# Patient Record
Sex: Male | Born: 1975 | Race: White | Hispanic: No | Smoking: Former smoker
Health system: Southern US, Community
[De-identification: ages and names within clinical notes are randomized; demographics above are authoritative.]

## PROBLEM LIST (undated history)

## (undated) DIAGNOSIS — M87051 Idiopathic aseptic necrosis of right femur: Secondary | ICD-10-CM

## (undated) DIAGNOSIS — F32A Depression, unspecified: Secondary | ICD-10-CM

## (undated) DIAGNOSIS — K449 Diaphragmatic hernia without obstruction or gangrene: Secondary | ICD-10-CM

## (undated) DIAGNOSIS — F329 Major depressive disorder, single episode, unspecified: Secondary | ICD-10-CM

## (undated) DIAGNOSIS — F419 Anxiety disorder, unspecified: Secondary | ICD-10-CM

## (undated) DIAGNOSIS — K219 Gastro-esophageal reflux disease without esophagitis: Secondary | ICD-10-CM

## (undated) DIAGNOSIS — I10 Essential (primary) hypertension: Secondary | ICD-10-CM

## (undated) HISTORY — PX: TONSILLECTOMY: SUR1361

## (undated) HISTORY — DX: Gastro-esophageal reflux disease without esophagitis: K21.9

---

## 2010-11-11 ENCOUNTER — Emergency Department (HOSPITAL_COMMUNITY): Payer: Self-pay

## 2010-11-11 ENCOUNTER — Emergency Department (HOSPITAL_COMMUNITY)
Admission: EM | Admit: 2010-11-11 | Discharge: 2010-11-11 | Disposition: A | Discharge status: Home / Self Care | Payer: Self-pay | Attending: Emergency Medicine | Admitting: Emergency Medicine

## 2010-11-11 DIAGNOSIS — R109 Unspecified abdominal pain: Secondary | ICD-10-CM | POA: Insufficient documentation

## 2010-11-11 DIAGNOSIS — S2239XA Fracture of one rib, unspecified side, initial encounter for closed fracture: Secondary | ICD-10-CM | POA: Insufficient documentation

## 2010-11-11 DIAGNOSIS — R1011 Right upper quadrant pain: Secondary | ICD-10-CM | POA: Insufficient documentation

## 2010-11-11 DIAGNOSIS — F3289 Other specified depressive episodes: Secondary | ICD-10-CM | POA: Insufficient documentation

## 2010-11-11 DIAGNOSIS — K219 Gastro-esophageal reflux disease without esophagitis: Secondary | ICD-10-CM | POA: Insufficient documentation

## 2010-11-11 DIAGNOSIS — R079 Chest pain, unspecified: Secondary | ICD-10-CM | POA: Insufficient documentation

## 2010-11-11 DIAGNOSIS — F329 Major depressive disorder, single episode, unspecified: Secondary | ICD-10-CM | POA: Insufficient documentation

## 2010-11-11 LAB — POCT I-STAT, CHEM 8
Calcium, Ion: 1.15 mmol/L (ref 1.12–1.32)
Chloride: 104 mEq/L (ref 96–112)
HCT: 40 % (ref 39.0–52.0)
Potassium: 4 mEq/L (ref 3.5–5.1)
Sodium: 141 mEq/L (ref 135–145)

## 2010-11-11 LAB — DIFFERENTIAL
Basophils Relative: 0 % (ref 0–1)
Eosinophils Absolute: 0.4 10*3/uL (ref 0.0–0.7)
Lymphs Abs: 1.7 10*3/uL (ref 0.7–4.0)
Neutro Abs: 2.9 10*3/uL (ref 1.7–7.7)
Neutrophils Relative %: 51 % (ref 43–77)

## 2010-11-11 LAB — CBC
MCV: 85.7 fL (ref 78.0–100.0)
Platelets: 158 10*3/uL (ref 150–400)
RBC: 4.56 MIL/uL (ref 4.22–5.81)
WBC: 5.7 10*3/uL (ref 4.0–10.5)

## 2010-11-11 MED ORDER — IOHEXOL 300 MG/ML  SOLN
100.0000 mL | Freq: Once | INTRAMUSCULAR | Status: AC | PRN
  Administered 2010-11-11: 100 mL via INTRAVENOUS

## 2013-04-18 ENCOUNTER — Encounter (HOSPITAL_COMMUNITY): Payer: Self-pay | Admitting: Emergency Medicine

## 2013-04-18 ENCOUNTER — Emergency Department (HOSPITAL_COMMUNITY): Payer: Self-pay

## 2013-04-18 ENCOUNTER — Emergency Department (HOSPITAL_COMMUNITY)
Admission: EM | Admit: 2013-04-18 | Discharge: 2013-04-18 | Disposition: A | Discharge status: Home / Self Care | Payer: Self-pay | Attending: Emergency Medicine | Admitting: Emergency Medicine

## 2013-04-18 DIAGNOSIS — Y929 Unspecified place or not applicable: Secondary | ICD-10-CM | POA: Insufficient documentation

## 2013-04-18 DIAGNOSIS — F17200 Nicotine dependence, unspecified, uncomplicated: Secondary | ICD-10-CM | POA: Insufficient documentation

## 2013-04-18 DIAGNOSIS — Z8719 Personal history of other diseases of the digestive system: Secondary | ICD-10-CM | POA: Insufficient documentation

## 2013-04-18 DIAGNOSIS — R296 Repeated falls: Secondary | ICD-10-CM | POA: Insufficient documentation

## 2013-04-18 DIAGNOSIS — S298XXA Other specified injuries of thorax, initial encounter: Secondary | ICD-10-CM | POA: Insufficient documentation

## 2013-04-18 DIAGNOSIS — I10 Essential (primary) hypertension: Secondary | ICD-10-CM | POA: Insufficient documentation

## 2013-04-18 DIAGNOSIS — R0789 Other chest pain: Secondary | ICD-10-CM

## 2013-04-18 DIAGNOSIS — Y939 Activity, unspecified: Secondary | ICD-10-CM | POA: Insufficient documentation

## 2013-04-18 DIAGNOSIS — Z79899 Other long term (current) drug therapy: Secondary | ICD-10-CM | POA: Insufficient documentation

## 2013-04-18 HISTORY — DX: Diaphragmatic hernia without obstruction or gangrene: K44.9

## 2013-04-18 HISTORY — DX: Essential (primary) hypertension: I10

## 2013-04-18 LAB — I-STAT CHEM 8, ED
BUN: 8 mg/dL (ref 6–23)
CALCIUM ION: 1.16 mmol/L (ref 1.12–1.23)
CREATININE: 0.8 mg/dL (ref 0.50–1.35)
Chloride: 104 mEq/L (ref 96–112)
GLUCOSE: 94 mg/dL (ref 70–99)
HEMATOCRIT: 44 % (ref 39.0–52.0)
HEMOGLOBIN: 15 g/dL (ref 13.0–17.0)
POTASSIUM: 3.6 meq/L — AB (ref 3.7–5.3)
Sodium: 140 mEq/L (ref 137–147)
TCO2: 24 mmol/L (ref 0–100)

## 2013-04-18 LAB — I-STAT TROPONIN, ED: Troponin i, poc: 0 ng/mL (ref 0.00–0.08)

## 2013-04-18 MED ORDER — OXYCODONE-ACETAMINOPHEN 5-325 MG PO TABS: 1.0000 | ORAL_TABLET | Freq: Four times a day (QID) | ORAL | Status: DC | PRN

## 2013-04-18 NOTE — ED Provider Notes (Addendum)
CSN: [id number]     Arrival date & time 04/18/13  1133 History   First MD Initiated Contact with Patient 04/18/13 1320     Chief Complaint  Patient presents with  . Rib Injury     (Consider location/radiation/quality/duration/timing/severity/associated sxs/prior Treatment) Patient is a 38 y.o. male presenting with chest pain. The history is provided by the patient.  Chest Pain Pain location:  L chest Pain quality: sharp, shooting and stabbing   Pain radiates to:  Does not radiate Pain radiates to the back: no   Pain severity:  Severe Onset quality:  Sudden Duration:  24 hours Timing:  Constant Progression:  Unchanged Chronicity:  New Context: breathing, lifting and raising an arm   Relieved by:  Nothing Worsened by:  Certain positions, coughing, deep breathing and movement Ineffective treatments: nsaids. Associated symptoms: no abdominal pain, no back pain, no cough, no fever, no nausea, no shortness of breath, not vomiting and no weakness   Risk factors: hypertension and smoking   Risk factors: no coronary artery disease and no diabetes mellitus     Past Medical History  Diagnosis Date  . Hernia, hiatal   . Hypertension    History reviewed. No pertinent past surgical history. History reviewed. No pertinent family history. History  Substance Use Topics  . Smoking status: Current Some Day Smoker  . Smokeless tobacco: Not on file  . Alcohol Use: Yes     Comment: social    Review of Systems  Constitutional: Negative for fever.  Respiratory: Negative for cough and shortness of breath.   Cardiovascular: Positive for chest pain.  Gastrointestinal: Negative for nausea, vomiting and abdominal pain.  Musculoskeletal: Negative for back pain.  Neurological: Negative for weakness.  All other systems reviewed and are negative.      Allergies  Review of patient's allergies indicates no known allergies.  Home Medications   Current Outpatient Rx  Name  Route  Sig   Dispense  Refill  . ibuprofen (ADVIL,MOTRIN) 200 MG tablet   Oral   Take 400 mg by mouth every 6 (six) hours as needed.         . metoprolol (LOPRESSOR) 50 MG tablet   Oral   Take 50 mg by mouth daily. Verified with pharmacy not XL and once daily         . omeprazole (PRILOSEC) 20 MG capsule   Oral   Take 20 mg by mouth daily.          BP 144/90  Pulse 97  Temp(Src) 98.4 F (36.9 C) (Oral)  Resp 20  SpO2 98% Physical Exam  Nursing note and vitals reviewed. Constitutional: He is oriented to person, place, and time. He appears well-developed and well-nourished. No distress.  HENT:  Head: Normocephalic and atraumatic.  Mouth/Throat: Oropharynx is clear and moist.  Eyes: Conjunctivae and EOM are normal. Pupils are equal, round, and reactive to light.  Neck: Normal range of motion. Neck supple.  Cardiovascular: Normal rate, regular rhythm and intact distal pulses.   No murmur heard. Pulmonary/Chest: Effort normal and breath sounds normal. No respiratory distress. He has no wheezes. He has no rales. He exhibits tenderness. He exhibits no crepitus and no retraction.    Abdominal: Soft. He exhibits no distension. There is no tenderness. There is no rebound and no guarding.  Musculoskeletal: Normal range of motion. He exhibits no edema and no tenderness.  Neurological: He is alert and oriented to person, place, and time.  Skin: Skin is warm and  dry. No rash noted. No erythema.  Psychiatric: He has a normal mood and affect. His behavior is normal.    ED Course  Procedures (including critical care time) Labs Review Labs Reviewed  I-STAT CHEM 8, ED - Abnormal; Notable for the following:    Potassium 3.6 (*)    All other components within normal limits  I-STAT TROPOININ, ED   Imaging Review Dg Chest 2 View  04/18/2013   CLINICAL DATA:  Pain post trauma  EXAM: CHEST  2 VIEW  COMPARISON:  July 22, 2011  FINDINGS: The lungs are clear. The heart size and pulmonary vascularity  are normal. No adenopathy. No pneumothorax. No fractures are apparent.  IMPRESSION: No abnormality noted.   Electronically Signed   By: Lowella Grip M.D.   On: 04/18/2013 13:09     EKG Interpretation   Date/Time:  Wednesday April 18 2013 11:50:20 EST Ventricular Rate:  98 PR Interval:  136 QRS Duration: 94 QT Interval:  343 QTC Calculation: 438 R Axis:   67 Text Interpretation:  Sinus rhythm Borderline T wave abnormalities  Baseline wander in lead(s) II No previous tracing Confirmed by Maryan Rued   MD, Loree Fee ([id number]) on 04/18/2013 1:30:55 PM      MDM   Final diagnoses:  Chest wall pain    Pt with mechanical fall on Thursday from his loader onto concrete with persistent right-sided chest pain and arm pain. It patient states that yesterday he developed severe left pectoralis pain that is worse with deep breathing and moving his arm. Significantly tender to palpation with clear lung sounds and normal heart sounds.  Chest x-ray within normal limits an EKG showed nonspecific T-wave inversion in V2 and V3.  Patient denies any associated symptoms that have a low suspicion for cardiac disease. He has no risk factors for PE and he perked negative. Feel most likely the pain is from the fall as there is no sign of rash concerning for zoster.  We'll get an outside EKG from [facility] to compare to insure his EKG is unchanged.  2:12 PM Unable to find old EKG.  i-stat and trop ordered.  2:37 PM Labs wnl.  Pt has had pain now for 24hours so would expect trop to be elevated.  Low heart score.  Pt d/ced home with pain meds.  Blanchie Dessert, MD 04/18/13 [place], MD 04/18/13 1439

## 2013-04-18 NOTE — Discharge Instructions (Signed)

## 2013-04-18 NOTE — ED Notes (Signed)
Per pt, he fell Thursday on concrete.  No pain prior to fall.  Pt states fell on right side.  Pain to rt side and arm and now pain presenting to left chest.  Pt states pain increased with breathing, any movement. Pain increases with raising arms.  No coughing blood.

## 2015-12-09 ENCOUNTER — Other Ambulatory Visit: Payer: Self-pay | Admitting: Neurosurgery

## 2015-12-09 DIAGNOSIS — M5416 Radiculopathy, lumbar region: Secondary | ICD-10-CM

## 2015-12-18 ENCOUNTER — Other Ambulatory Visit: Payer: Self-pay | Admitting: Neurosurgery

## 2015-12-23 ENCOUNTER — Ambulatory Visit
Admission: RE | Admit: 2015-12-23 | Discharge: 2015-12-23 | Disposition: A | Discharge status: Home / Self Care | Payer: BLUE CROSS/BLUE SHIELD | Source: Ambulatory Visit | Attending: Neurosurgery | Admitting: Neurosurgery

## 2015-12-23 DIAGNOSIS — M5416 Radiculopathy, lumbar region: Secondary | ICD-10-CM

## 2015-12-23 MED ORDER — METHYLPREDNISOLONE ACETATE 40 MG/ML INJ SUSP (RADIOLOG
120.0000 mg | Freq: Once | INTRAMUSCULAR | Status: AC
  Administered 2015-12-23: 120 mg via EPIDURAL

## 2015-12-23 MED ORDER — IOPAMIDOL (ISOVUE-M 200) INJECTION 41%
1.0000 mL | Freq: Once | INTRAMUSCULAR | Status: AC
  Administered 2015-12-23: 1 mL via EPIDURAL

## 2015-12-23 NOTE — Discharge Instructions (Signed)

## 2016-01-19 ENCOUNTER — Other Ambulatory Visit: Payer: Self-pay | Admitting: Neurosurgery

## 2016-01-19 DIAGNOSIS — M5416 Radiculopathy, lumbar region: Secondary | ICD-10-CM

## 2016-05-28 ENCOUNTER — Encounter: Payer: Self-pay | Admitting: Gastroenterology

## 2016-06-03 NOTE — Progress Notes (Addendum)
PCP: Dr. Myna Bright  Cardiologist: pt denies  EKG: pt denies past year  Stress test: pt denies ever  ECHO: pt denies ever  Cardiac Cath: pt denies ever  Chest x-ray: pt denies past year

## 2016-06-03 NOTE — Pre-Procedure Instructions (Signed)
Judge Stall II  06/03/2016      [id number] [address], [place] [address] Phone: [phone] Fax: [phone]  [facility] [address], [place] [address] Phone: [phone] Fax: [phone]    Your procedure is scheduled on May 1 at 1045 AM.  Report to [facility] at 845 AM.  Call this number if you have problems the morning of surgery:  [phone]   Remember:  Do not eat food or drink liquids after midnight.  Take these medicines the morning of surgery with A SIP OF WATER omeprazole (prilosec), vortioxetine (trintellix), oxycodone=acetaminophen (percocet)-if needed for pain.  Take all other medications as prescribed except 7 days prior to surgery STOP taking any Aspirin, Aleve, Naproxen, Ibuprofen, Motrin, Advil, Goody's, BC's, all herbal medications, fish oil, and all vitamins   Do not wear jewelry, make-up or nail polish.  Do not wear lotions, powders, or perfumes, or deoderant.             Men may shave face and neck.  Do not bring valuables to the hospital.  [facility] is not responsible for any belongings or valuables.  Contacts, dentures or bridgework may not be worn into surgery.  Leave your suitcase in the car.  After surgery it may be brought to your room.  For patients admitted to the hospital, discharge time will be determined by your treatment team.  Patients discharged the day of surgery will not be allowed to drive home.    Special instructions:   [facility]- Preparing For Surgery  Before surgery, you can play an important role. Because skin is not sterile, your skin needs to be as free of germs as possible. You can reduce the number of germs on your skin by washing with CHG (chlorahexidine gluconate) Soap before surgery.  CHG is an antiseptic cleaner which kills germs and bonds with the skin to continue killing germs even after  washing.  Please do not use if you have an allergy to CHG or antibacterial soaps. If your skin becomes reddened/irritated stop using the CHG.  Do not shave (including legs and underarms) for at least 48 hours prior to first CHG shower. It is OK to shave your face.  Please follow these instructions carefully.   1. Shower the NIGHT BEFORE SURGERY and the MORNING OF SURGERY with CHG.   2. If you chose to wash your hair, wash your hair first as usual with your normal shampoo.  3. After you shampoo, rinse your hair and body thoroughly to remove the shampoo.  4. Use CHG as you would any other liquid soap. You can apply CHG directly to the skin and wash gently with a scrungie or a clean washcloth.   5. Apply the CHG Soap to your body ONLY FROM THE NECK DOWN.  Do not use on open wounds or open sores. Avoid contact with your eyes, ears, mouth and genitals (private parts). Wash genitals (private parts) with your normal soap.  6. Wash thoroughly, paying special attention to the area where your surgery will be performed.  7. Thoroughly rinse your body with warm water from the neck down.  8. DO NOT shower/wash with your normal soap after using and rinsing off the CHG Soap.  9. Pat yourself dry with a CLEAN TOWEL.   10. Wear CLEAN PAJAMAS   11. Place CLEAN SHEETS on your  bed the night of your first shower and DO NOT SLEEP WITH PETS.    Day of Surgery: Do not apply any deodorants/lotions. Please wear clean clothes to the hospital/surgery center.     Please read over the following fact sheets that you were given. Pain Booklet, Coughing and Deep Breathing, MRSA Information and Surgical Site Infection Prevention

## 2016-06-04 ENCOUNTER — Encounter (HOSPITAL_COMMUNITY)
Admission: RE | Admit: 2016-06-04 | Discharge: 2016-06-04 | Disposition: A | Discharge status: Home / Self Care | Payer: BLUE CROSS/BLUE SHIELD | Source: Ambulatory Visit | Attending: Otolaryngology | Admitting: Otolaryngology

## 2016-06-04 ENCOUNTER — Other Ambulatory Visit: Payer: Self-pay | Admitting: Orthopedic Surgery

## 2016-06-04 ENCOUNTER — Encounter (HOSPITAL_COMMUNITY): Payer: Self-pay

## 2016-06-04 DIAGNOSIS — Z01810 Encounter for preprocedural cardiovascular examination: Secondary | ICD-10-CM | POA: Insufficient documentation

## 2016-06-04 DIAGNOSIS — I10 Essential (primary) hypertension: Secondary | ICD-10-CM

## 2016-06-04 DIAGNOSIS — Z01812 Encounter for preprocedural laboratory examination: Secondary | ICD-10-CM | POA: Insufficient documentation

## 2016-06-04 HISTORY — DX: Essential (primary) hypertension: I10

## 2016-06-04 LAB — BASIC METABOLIC PANEL
Anion gap: 11 (ref 5–15)
BUN: 17 mg/dL (ref 6–20)
CO2: 22 mmol/L (ref 22–32)
CREATININE: 0.95 mg/dL (ref 0.61–1.24)
Calcium: 9.6 mg/dL (ref 8.9–10.3)
Chloride: 106 mmol/L (ref 101–111)
Glucose, Bld: 127 mg/dL — ABNORMAL HIGH (ref 65–99)
Potassium: 4.2 mmol/L (ref 3.5–5.1)
SODIUM: 139 mmol/L (ref 135–145)

## 2016-06-04 LAB — SURGICAL PCR SCREEN
MRSA, PCR: NEGATIVE
Staphylococcus aureus: NEGATIVE

## 2016-06-04 LAB — CBC
HCT: 46.7 % (ref 39.0–52.0)
Hemoglobin: 15.5 g/dL (ref 13.0–17.0)
MCH: 26.6 pg (ref 26.0–34.0)
MCHC: 33.2 g/dL (ref 30.0–36.0)
MCV: 80.2 fL (ref 78.0–100.0)
PLATELETS: 174 10*3/uL (ref 150–400)
RBC: 5.82 MIL/uL — AB (ref 4.22–5.81)
RDW: 14.3 % (ref 11.5–15.5)
WBC: 9.7 10*3/uL (ref 4.0–10.5)

## 2016-06-14 ENCOUNTER — Other Ambulatory Visit: Payer: Self-pay

## 2016-06-14 ENCOUNTER — Ambulatory Visit: Payer: BLUE CROSS/BLUE SHIELD | Admitting: Gastroenterology

## 2016-06-14 MED ORDER — DEXTROSE 5 % IV SOLN
3.0000 g | INTRAVENOUS | Status: AC
  Administered 2016-06-15: 3 g via INTRAVENOUS
  Filled 2016-06-14: qty 3000

## 2016-06-15 ENCOUNTER — Encounter (HOSPITAL_COMMUNITY)
Admission: RE | Disposition: A | Discharge status: Home Health | Payer: Self-pay | Source: Ambulatory Visit | Attending: Orthopedic Surgery

## 2016-06-15 ENCOUNTER — Inpatient Hospital Stay (HOSPITAL_COMMUNITY): Payer: BLUE CROSS/BLUE SHIELD | Admitting: Anesthesiology

## 2016-06-15 ENCOUNTER — Inpatient Hospital Stay (HOSPITAL_COMMUNITY)
Admission: RE | Admit: 2016-06-15 | Discharge: 2016-06-16 | DRG: 470 | Disposition: A | Discharge status: Home Health | Payer: BLUE CROSS/BLUE SHIELD | Source: Ambulatory Visit | Attending: Orthopedic Surgery | Admitting: Orthopedic Surgery

## 2016-06-15 ENCOUNTER — Encounter (HOSPITAL_COMMUNITY): Payer: Self-pay | Admitting: *Deleted

## 2016-06-15 ENCOUNTER — Inpatient Hospital Stay (HOSPITAL_COMMUNITY): Payer: BLUE CROSS/BLUE SHIELD

## 2016-06-15 DIAGNOSIS — F329 Major depressive disorder, single episode, unspecified: Secondary | ICD-10-CM | POA: Diagnosis present

## 2016-06-15 DIAGNOSIS — M87052 Idiopathic aseptic necrosis of left femur: Secondary | ICD-10-CM | POA: Diagnosis present

## 2016-06-15 DIAGNOSIS — M87852 Other osteonecrosis, left femur: Principal | ICD-10-CM | POA: Diagnosis present

## 2016-06-15 DIAGNOSIS — Z6836 Body mass index (BMI) 36.0-36.9, adult: Secondary | ICD-10-CM | POA: Diagnosis not present

## 2016-06-15 DIAGNOSIS — M1610 Unilateral primary osteoarthritis, unspecified hip: Secondary | ICD-10-CM

## 2016-06-15 DIAGNOSIS — I10 Essential (primary) hypertension: Secondary | ICD-10-CM | POA: Diagnosis present

## 2016-06-15 DIAGNOSIS — Z87891 Personal history of nicotine dependence: Secondary | ICD-10-CM | POA: Diagnosis not present

## 2016-06-15 DIAGNOSIS — Z79899 Other long term (current) drug therapy: Secondary | ICD-10-CM | POA: Diagnosis not present

## 2016-06-15 DIAGNOSIS — M87051 Idiopathic aseptic necrosis of right femur: Secondary | ICD-10-CM

## 2016-06-15 HISTORY — PX: TOTAL HIP ARTHROPLASTY: SHX124

## 2016-06-15 HISTORY — DX: Anxiety disorder, unspecified: F41.9

## 2016-06-15 HISTORY — DX: Major depressive disorder, single episode, unspecified: F32.9

## 2016-06-15 HISTORY — DX: Idiopathic aseptic necrosis of right femur: M87.051

## 2016-06-15 HISTORY — DX: Depression, unspecified: F32.A

## 2016-06-15 SURGERY — ARTHROPLASTY, HIP, TOTAL,POSTERIOR APPROACH
Anesthesia: Monitor Anesthesia Care | Site: Hip | Laterality: Left

## 2016-06-15 MED ORDER — PROMETHAZINE HCL 25 MG/ML IJ SOLN: 6.2500 mg | INTRAMUSCULAR | Status: DC | PRN

## 2016-06-15 MED ORDER — METHOCARBAMOL 500 MG PO TABS
ORAL_TABLET | ORAL | Status: AC
  Filled 2016-06-15: qty 1

## 2016-06-15 MED ORDER — MENTHOL 3 MG MT LOZG: 1.0000 | LOZENGE | OROMUCOSAL | Status: DC | PRN

## 2016-06-15 MED ORDER — SENNA 8.6 MG PO TABS
1.0000 | ORAL_TABLET | Freq: Two times a day (BID) | ORAL | Status: DC
  Administered 2016-06-15 – 2016-06-16 (×2): 8.6 mg via ORAL
  Filled 2016-06-15 (×2): qty 1

## 2016-06-15 MED ORDER — OXYCODONE HCL 5 MG PO TABS
ORAL_TABLET | ORAL | Status: AC
  Filled 2016-06-15: qty 2

## 2016-06-15 MED ORDER — ONDANSETRON HCL 4 MG PO TABS: 4.0000 mg | ORAL_TABLET | Freq: Three times a day (TID) | ORAL | 0 refills | Status: DC | PRN

## 2016-06-15 MED ORDER — HYDROMORPHONE HCL 1 MG/ML IJ SOLN
INTRAMUSCULAR | Status: AC
  Filled 2016-06-15: qty 0.5

## 2016-06-15 MED ORDER — SCOPOLAMINE 1 MG/3DAYS TD PT72
1.0000 | MEDICATED_PATCH | TRANSDERMAL | Status: DC
  Filled 2016-06-15: qty 1

## 2016-06-15 MED ORDER — DIPHENHYDRAMINE HCL 50 MG/ML IJ SOLN
INTRAMUSCULAR | Status: DC | PRN
  Administered 2016-06-15: 25 mg via INTRAVENOUS

## 2016-06-15 MED ORDER — ALUM & MAG HYDROXIDE-SIMETH 200-200-20 MG/5ML PO SUSP
30.0000 mL | ORAL | Status: DC | PRN
Start: 2016-06-15 — End: 2016-06-16

## 2016-06-15 MED ORDER — POLYETHYLENE GLYCOL 3350 17 G PO PACK: 17.0000 g | PACK | Freq: Every day | ORAL | Status: DC | PRN

## 2016-06-15 MED ORDER — FENTANYL CITRATE (PF) 100 MCG/2ML IJ SOLN
INTRAMUSCULAR | Status: DC | PRN
  Administered 2016-06-15 (×2): 50 ug via INTRAVENOUS

## 2016-06-15 MED ORDER — KETOROLAC TROMETHAMINE 15 MG/ML IJ SOLN
INTRAMUSCULAR | Status: AC
  Filled 2016-06-15: qty 1

## 2016-06-15 MED ORDER — ZOLPIDEM TARTRATE 5 MG PO TABS: 5.0000 mg | ORAL_TABLET | Freq: Every evening | ORAL | Status: DC | PRN

## 2016-06-15 MED ORDER — METOCLOPRAMIDE HCL 5 MG/ML IJ SOLN: 5.0000 mg | Freq: Three times a day (TID) | INTRAMUSCULAR | Status: DC | PRN

## 2016-06-15 MED ORDER — OXYCODONE HCL 5 MG PO TABS
5.0000 mg | ORAL_TABLET | ORAL | Status: DC | PRN
  Administered 2016-06-15 – 2016-06-16 (×7): 10 mg via ORAL
  Filled 2016-06-15 (×6): qty 2

## 2016-06-15 MED ORDER — 0.9 % SODIUM CHLORIDE (POUR BTL) OPTIME
TOPICAL | Status: DC | PRN
  Administered 2016-06-15: 1000 mL

## 2016-06-15 MED ORDER — RIVAROXABAN 10 MG PO TABS
10.0000 mg | ORAL_TABLET | Freq: Every day | ORAL | Status: DC
  Administered 2016-06-16: 10 mg via ORAL
  Filled 2016-06-15: qty 1

## 2016-06-15 MED ORDER — METHOCARBAMOL 500 MG PO TABS
500.0000 mg | ORAL_TABLET | Freq: Four times a day (QID) | ORAL | Status: DC | PRN
Start: 2016-06-15 — End: 2016-06-16
  Administered 2016-06-15 – 2016-06-16 (×4): 500 mg via ORAL
  Filled 2016-06-15 (×3): qty 1

## 2016-06-15 MED ORDER — DEXAMETHASONE SODIUM PHOSPHATE 10 MG/ML IJ SOLN
10.0000 mg | Freq: Once | INTRAMUSCULAR | Status: AC
  Administered 2016-06-16: 10 mg via INTRAVENOUS
  Filled 2016-06-15: qty 1

## 2016-06-15 MED ORDER — HYDROMORPHONE HCL 1 MG/ML IJ SOLN
INTRAMUSCULAR | Status: AC
  Filled 2016-06-15: qty 1

## 2016-06-15 MED ORDER — BUPIVACAINE IN DEXTROSE 0.75-8.25 % IT SOLN
INTRATHECAL | Status: DC | PRN
  Administered 2016-06-15: 15 mg via INTRATHECAL

## 2016-06-15 MED ORDER — PROPOFOL 500 MG/50ML IV EMUL
INTRAVENOUS | Status: DC | PRN
  Administered 2016-06-15: 50 ug/kg/min via INTRAVENOUS

## 2016-06-15 MED ORDER — PROPOFOL 10 MG/ML IV BOLUS
INTRAVENOUS | Status: DC | PRN
  Administered 2016-06-15: 20 mg via INTRAVENOUS
  Administered 2016-06-15: 40 mg via INTRAVENOUS
  Administered 2016-06-15: 30 mg via INTRAVENOUS

## 2016-06-15 MED ORDER — PHENOL 1.4 % MT LIQD: 1.0000 | OROMUCOSAL | Status: DC | PRN

## 2016-06-15 MED ORDER — ACETAMINOPHEN 650 MG RE SUPP: 650.0000 mg | Freq: Four times a day (QID) | RECTAL | Status: DC | PRN

## 2016-06-15 MED ORDER — VORTIOXETINE HBR 20 MG PO TABS
20.0000 mg | ORAL_TABLET | Freq: Every day | ORAL | Status: DC
  Administered 2016-06-15 – 2016-06-16 (×2): 20 mg via ORAL
  Filled 2016-06-15 (×2): qty 20

## 2016-06-15 MED ORDER — ONDANSETRON HCL 4 MG/2ML IJ SOLN: 4.0000 mg | Freq: Four times a day (QID) | INTRAMUSCULAR | Status: DC | PRN

## 2016-06-15 MED ORDER — GABAPENTIN 300 MG PO CAPS
300.0000 mg | ORAL_CAPSULE | Freq: Three times a day (TID) | ORAL | Status: DC
  Administered 2016-06-15 – 2016-06-16 (×3): 300 mg via ORAL
  Filled 2016-06-15 (×3): qty 1

## 2016-06-15 MED ORDER — DOCUSATE SODIUM 100 MG PO CAPS
100.0000 mg | ORAL_CAPSULE | Freq: Two times a day (BID) | ORAL | Status: DC
  Administered 2016-06-15 – 2016-06-16 (×3): 100 mg via ORAL
  Filled 2016-06-15 (×3): qty 1

## 2016-06-15 MED ORDER — KETOROLAC TROMETHAMINE 15 MG/ML IJ SOLN
15.0000 mg | Freq: Four times a day (QID) | INTRAMUSCULAR | Status: AC
  Administered 2016-06-15 – 2016-06-16 (×4): 15 mg via INTRAVENOUS
  Filled 2016-06-15 (×3): qty 1

## 2016-06-15 MED ORDER — BISACODYL 10 MG RE SUPP: 10.0000 mg | Freq: Every day | RECTAL | Status: DC | PRN

## 2016-06-15 MED ORDER — RIVAROXABAN 10 MG PO TABS: 10.0000 mg | ORAL_TABLET | Freq: Every day | ORAL | 0 refills | Status: DC

## 2016-06-15 MED ORDER — ACETAMINOPHEN 325 MG PO TABS: 650.0000 mg | ORAL_TABLET | Freq: Four times a day (QID) | ORAL | Status: DC | PRN

## 2016-06-15 MED ORDER — CEFAZOLIN SODIUM-DEXTROSE 2-4 GM/100ML-% IV SOLN
2.0000 g | Freq: Four times a day (QID) | INTRAVENOUS | Status: AC
  Administered 2016-06-15 – 2016-06-16 (×2): 2 g via INTRAVENOUS
  Filled 2016-06-15 (×2): qty 100

## 2016-06-15 MED ORDER — MIDAZOLAM HCL 2 MG/2ML IJ SOLN
INTRAMUSCULAR | Status: AC
  Administered 2016-06-15: 2 mg via INTRAVENOUS
  Filled 2016-06-15: qty 2

## 2016-06-15 MED ORDER — HYDROMORPHONE HCL 1 MG/ML IJ SOLN
INTRAMUSCULAR | Status: AC
  Administered 2016-06-15: 0.5 mg via INTRAVENOUS
  Filled 2016-06-15: qty 0.5

## 2016-06-15 MED ORDER — OXYCODONE-ACETAMINOPHEN 10-325 MG PO TABS: 1.0000 | ORAL_TABLET | Freq: Four times a day (QID) | ORAL | 0 refills | Status: DC | PRN

## 2016-06-15 MED ORDER — DIPHENHYDRAMINE HCL 12.5 MG/5ML PO ELIX: 12.5000 mg | ORAL_SOLUTION | ORAL | Status: DC | PRN

## 2016-06-15 MED ORDER — MAGNESIUM CITRATE PO SOLN
1.0000 | Freq: Once | ORAL | Status: DC | PRN
Start: 2016-06-15 — End: 2016-06-16

## 2016-06-15 MED ORDER — POTASSIUM CHLORIDE IN NACL 20-0.45 MEQ/L-% IV SOLN
INTRAVENOUS | Status: DC
  Administered 2016-06-15 – 2016-06-16 (×2): via INTRAVENOUS
  Filled 2016-06-15 (×2): qty 1000

## 2016-06-15 MED ORDER — FENTANYL CITRATE (PF) 250 MCG/5ML IJ SOLN
INTRAMUSCULAR | Status: AC
  Filled 2016-06-15: qty 5

## 2016-06-15 MED ORDER — SODIUM CHLORIDE 0.9 % IR SOLN
Status: DC | PRN
  Administered 2016-06-15: 1000 mL

## 2016-06-15 MED ORDER — PANTOPRAZOLE SODIUM 40 MG PO TBEC
80.0000 mg | DELAYED_RELEASE_TABLET | Freq: Every day | ORAL | Status: DC
  Administered 2016-06-15 – 2016-06-16 (×2): 80 mg via ORAL
  Filled 2016-06-15 (×2): qty 2

## 2016-06-15 MED ORDER — SENNA-DOCUSATE SODIUM 8.6-50 MG PO TABS: 2.0000 | ORAL_TABLET | Freq: Every day | ORAL | 1 refills | Status: DC

## 2016-06-15 MED ORDER — HYDROMORPHONE HCL 1 MG/ML IJ SOLN
0.5000 mg | INTRAMUSCULAR | Status: DC | PRN
  Administered 2016-06-15 – 2016-06-16 (×5): 0.5 mg via INTRAVENOUS
  Filled 2016-06-15 (×5): qty 1

## 2016-06-15 MED ORDER — METOCLOPRAMIDE HCL 5 MG PO TABS: 5.0000 mg | ORAL_TABLET | Freq: Three times a day (TID) | ORAL | Status: DC | PRN

## 2016-06-15 MED ORDER — MIDAZOLAM HCL 5 MG/5ML IJ SOLN
INTRAMUSCULAR | Status: DC | PRN
  Administered 2016-06-15 (×2): 1 mg via INTRAVENOUS

## 2016-06-15 MED ORDER — DEXAMETHASONE SODIUM PHOSPHATE 10 MG/ML IJ SOLN
INTRAMUSCULAR | Status: DC | PRN
  Administered 2016-06-15: 10 mg via INTRAVENOUS

## 2016-06-15 MED ORDER — ONDANSETRON HCL 4 MG PO TABS: 4.0000 mg | ORAL_TABLET | Freq: Four times a day (QID) | ORAL | Status: DC | PRN

## 2016-06-15 MED ORDER — METHOCARBAMOL 1000 MG/10ML IJ SOLN: 500.0000 mg | Freq: Four times a day (QID) | INTRAVENOUS | Status: DC | PRN

## 2016-06-15 MED ORDER — HYDROMORPHONE HCL 1 MG/ML IJ SOLN
0.2500 mg | INTRAMUSCULAR | Status: DC | PRN
  Administered 2016-06-15: 0.5 mg via INTRAVENOUS
  Administered 2016-06-15: 1 mg via INTRAVENOUS
  Administered 2016-06-15: 0.5 mg via INTRAVENOUS

## 2016-06-15 MED ORDER — ONDANSETRON HCL 4 MG/2ML IJ SOLN
INTRAMUSCULAR | Status: DC | PRN
  Administered 2016-06-15: 4 mg via INTRAVENOUS

## 2016-06-15 MED ORDER — BACLOFEN 10 MG PO TABS: 10.0000 mg | ORAL_TABLET | Freq: Three times a day (TID) | ORAL | 0 refills | Status: DC

## 2016-06-15 MED ORDER — MIDAZOLAM HCL 2 MG/2ML IJ SOLN
INTRAMUSCULAR | Status: AC
  Filled 2016-06-15: qty 2

## 2016-06-15 MED ORDER — LACTATED RINGERS IV SOLN
INTRAVENOUS | Status: DC
  Administered 2016-06-15 (×3): via INTRAVENOUS

## 2016-06-15 MED ORDER — MIDAZOLAM HCL 2 MG/2ML IJ SOLN
2.0000 mg | Freq: Once | INTRAMUSCULAR | Status: AC
  Administered 2016-06-15: 2 mg via INTRAVENOUS

## 2016-06-15 SURGICAL SUPPLY — 52 items
BIT DRILL 5/64X5 DISP (BIT) ×2 IMPLANT
BLADE SAW SAG 73X25 THK (BLADE) ×1
BLADE SAW SGTL 73X25 THK (BLADE) ×1 IMPLANT
CAPT HIP TOTAL 2 ×2 IMPLANT
CLSR STERI-STRIP ANTIMIC 1/2X4 (GAUZE/BANDAGES/DRESSINGS) ×2 IMPLANT
COVER SURGICAL LIGHT HANDLE (MISCELLANEOUS) ×2 IMPLANT
DRAPE INCISE IOBAN 66X45 STRL (DRAPES) IMPLANT
DRAPE ORTHO SPLIT 77X108 STRL (DRAPES) ×2
DRAPE SURG ORHT 6 SPLT 77X108 (DRAPES) ×2 IMPLANT
DRAPE U-SHAPE 47X51 STRL (DRAPES) ×2 IMPLANT
DRSG MEPILEX BORDER 4X12 (GAUZE/BANDAGES/DRESSINGS) ×2 IMPLANT
DRSG MEPILEX BORDER 4X8 (GAUZE/BANDAGES/DRESSINGS) IMPLANT
DURAPREP 26ML APPLICATOR (WOUND CARE) ×4 IMPLANT
ELECT BLADE 4.0 EZ CLEAN MEGAD (MISCELLANEOUS) ×2
ELECT CAUTERY BLADE 6.4 (BLADE) ×2 IMPLANT
ELECT REM PT RETURN 9FT ADLT (ELECTROSURGICAL) ×2
ELECTRODE BLDE 4.0 EZ CLN MEGD (MISCELLANEOUS) ×1 IMPLANT
ELECTRODE REM PT RTRN 9FT ADLT (ELECTROSURGICAL) ×1 IMPLANT
GLOVE BIOGEL PI ORTHO PRO SZ8 (GLOVE) ×2
GLOVE ORTHO TXT STRL SZ7.5 (GLOVE) ×2 IMPLANT
GLOVE PI ORTHO PRO STRL SZ8 (GLOVE) ×2 IMPLANT
GLOVE SURG ORTHO 8.0 STRL STRW (GLOVE) ×2 IMPLANT
GOWN STRL REUS W/ TWL XL LVL3 (GOWN DISPOSABLE) ×1 IMPLANT
GOWN STRL REUS W/TWL 2XL LVL3 (GOWN DISPOSABLE) ×2 IMPLANT
GOWN STRL REUS W/TWL XL LVL3 (GOWN DISPOSABLE) ×1
HOOD PEEL AWAY FACE SHEILD DIS (HOOD) ×6 IMPLANT
KIT BASIN OR (CUSTOM PROCEDURE TRAY) ×2 IMPLANT
KIT ROOM TURNOVER OR (KITS) ×2 IMPLANT
MANIFOLD NEPTUNE II (INSTRUMENTS) ×2 IMPLANT
NDL SAFETY ECLIPSE 18X1.5 (NEEDLE) IMPLANT
NEEDLE HYPO 18GX1.5 SHARP (NEEDLE)
NS IRRIG 1000ML POUR BTL (IV SOLUTION) ×2 IMPLANT
PACK TOTAL JOINT (CUSTOM PROCEDURE TRAY) ×2 IMPLANT
PAD ARMBOARD 7.5X6 YLW CONV (MISCELLANEOUS) ×6 IMPLANT
PILLOW ABDUCTION HIP (SOFTGOODS) ×2 IMPLANT
PRESSURIZER FEMORAL UNIV (MISCELLANEOUS) IMPLANT
RETRIEVER SUT HEWSON (MISCELLANEOUS) ×2 IMPLANT
SUCTION FRAZIER HANDLE 10FR (MISCELLANEOUS) ×1
SUCTION TUBE FRAZIER 10FR DISP (MISCELLANEOUS) ×1 IMPLANT
SUT FIBERWIRE #2 38 REV NDL BL (SUTURE) ×6
SUT VIC AB 0 CT1 27 (SUTURE) ×2
SUT VIC AB 0 CT1 27XBRD ANBCTR (SUTURE) ×2 IMPLANT
SUT VIC AB 2-0 CT1 27 (SUTURE) ×1
SUT VIC AB 2-0 CT1 TAPERPNT 27 (SUTURE) ×1 IMPLANT
SUT VIC AB 3-0 SH 8-18 (SUTURE) ×2 IMPLANT
SUTURE FIBERWR#2 38 REV NDL BL (SUTURE) ×3 IMPLANT
SYR CONTROL 10ML LL (SYRINGE) ×2 IMPLANT
TOWEL OR 17X24 6PK STRL BLUE (TOWEL DISPOSABLE) ×2 IMPLANT
TOWEL OR 17X26 10 PK STRL BLUE (TOWEL DISPOSABLE) ×2 IMPLANT
TRAY CATH 16FR W/PLASTIC CATH (SET/KITS/TRAYS/PACK) IMPLANT
TRAY FOLEY CATH SILVER 14FR (SET/KITS/TRAYS/PACK) IMPLANT
TUBE CONNECTING 12X1/4 (SUCTIONS) ×4 IMPLANT

## 2016-06-15 NOTE — Op Note (Addendum)
06/15/2016  1:59 PM  PATIENT:  Leroy Hebert   MRN: [id number]  PRE-OPERATIVE DIAGNOSIS:  Avascular necrosis of bone of left hip ([facility])  POST-OPERATIVE DIAGNOSIS:  Avascular necrosis of bone of left hip (Leroy Hebert)  PROCEDURE:  Procedure(s): TOTAL HIP ARTHROPLASTY  PREOPERATIVE INDICATIONS:    Leroy Hebert is an 41 y.o. male who has a diagnosis of Avascular necrosis of bone of left hip (Leroy Hebert) and elected for surgical management after failing conservative treatment.  The risks benefits and alternatives were discussed with the patient including but not limited to the risks of nonoperative treatment, versus surgical intervention including infection, bleeding, nerve injury, periprosthetic fracture, the need for revision surgery, dislocation, leg length discrepancy, blood clots, cardiopulmonary complications, morbidity, mortality, among others, and they were willing to proceed.     OPERATIVE REPORT     SURGEON:  Marchia Bond, MD    ASSISTANT:  Joya Gaskins, OPA-C  (Present throughout the entire procedure,  necessary for completion of procedure in a timely manner, assisting with retraction, instrumentation, and closure)     ANESTHESIA:  Spinal  ESTIMATED BLOOD LOSS: 902 mL    COMPLICATIONS:  None.     UNIQUE ASPECTS OF THE CASE:  Access of the acetabulum was fairly challenging, but I did feel ultimately like it had an excellent press fit. The femoral head measured 60 mm, however it was very distorted and fragmented, and my starter reamer was a 54, and it was centralized and filled up the acetabulum fairly well, and I ended up with a 58 cup. The femur was extremely large, and actually fit a size 9 stem without much difficulty. He had a substantial external rotation contracture, and access posteriorly was very difficult and the soft tissue mobility was extremely contracted, and did not yield a good repair at the end. I did have excellent stability however posteriorly, and did not place  a lipped liner in order to minimize risk of impingement. I had a slight bit of anterior uncoverage, such that I was slightly anteverted with the cup.  COMPONENTS:  [employer] fit femur size 9 with a 36 mm + 5 head ball and a gription acetabular shell size 58 with an apex hole eliminator and a +4 neutral polyethylene liner.    PROCEDURE IN DETAIL:   The patient was met in the holding area and  identified.  The appropriate hip was identified and marked at the operative site.  The patient was then transported to the OR  and  placed under anesthesia.  At that point, the patient was  placed in the lateral decubitus position with the operative side up and  secured to the operating room table and all bony prominences padded.     The operative lower extremity was prepped from the iliac crest to the distal leg.  Sterile draping was performed.  Time out was performed prior to incision.      A routine posterolateral approach was utilized via sharp dissection  carried down to the subcutaneous tissue.  Gross bleeders were Bovie coagulated.  The iliotibial band was identified and incised along the length of the skin incision.  Self-retaining retractors were  inserted.  With the hip internally rotated, the short external rotators  were identified. Internally rotating the hip was extremely difficult, and did not give me much length on the capsule and soft tissues. The piriformis and capsule was tagged with FiberWire, and the hip capsule released in a T-type fashion.  The femoral  neck was exposed, and I resected the femoral neck using the appropriate jig. This was performed at approximately a thumb's breadth above the lesser trochanter.    I then exposed the deep acetabulum, cleared out any tissue including the ligamentum teres.  A wing retractor was placed.  After adequate visualization, I excised the labrum, and then sequentially reamed.  I placed the trial acetabulum, which seated nicely, and then impacted  the real cup into place.  Appropriate version and inclination was confirmed clinically matching their bony anatomy, and also with the use of the jig.  A trial polyethylene liner was placed and the wing retractor removed.    I then prepared the proximal femur using the cookie-cutter, the lateralizing reamer, and then sequentially reamed and broached.  A trial broach, neck, and head was utilized, and I reduced the hip and it was found to have excellent stability with functional range of motion. The trial components were then removed, and the real polyethylene liner was placed.  I then impacted the real femoral prosthesis into place into the appropriate version, slightly anteverted to the normal anatomy, and I impacted the real head ball into place. The hip was then reduced and taken through functional range of motion and found to have excellent stability. Leg lengths were restored.  I then used a 2 mm drill bits to pass the FiberWire suture from the capsule and piriformis through the greater trochanter, and secured this. Fair posterior capsular repair was achieved, however the tissue mobility and length was not really adequate to get completely back to bone, which I believe was due to his pre-existing severe external rotation contracture. I also closed the T in the capsule.  I then irrigated the hip copiously again with pulse lavage, and repaired the fascia with Vicryl, followed by Vicryl for the subcutaneous tissue, Monocryl for the skin, Steri-Strips and sterile gauze. The wounds were injected. The patient was then awakened and returned to PACU in stable and satisfactory condition. There were no complications.  Marchia Bond, MD Orthopedic Surgeon [phone]   06/15/2016 1:59 PM

## 2016-06-15 NOTE — Anesthesia Procedure Notes (Signed)
Spinal  Patient location during procedure: OR Start time: 06/15/2016 11:31 AM End time: 06/15/2016 11:41 AM Staffing Anesthesiologist: Duane Boston Performed: anesthesiologist  Preanesthetic Checklist Completed: patient identified, surgical consent, pre-op evaluation, timeout performed, IV checked, risks and benefits discussed and monitors and equipment checked Spinal Block Patient position: sitting Prep: DuraPrep Patient monitoring: cardiac monitor, continuous pulse ox and blood pressure Approach: midline Location: L2-3 Injection technique: single-shot Needle Needle type: Pencan  Needle gauge: 24 G Needle length: 9 cm Additional Notes Functioning IV was confirmed and monitors were applied. Sterile prep and drape, including hand hygiene and sterile gloves were used. The patient was positioned and the spine was prepped. The skin was anesthetized with lidocaine.  Free flow of clear CSF was obtained prior to injecting local anesthetic into the CSF.  The spinal needle aspirated freely following injection.  The needle was carefully withdrawn.  The patient tolerated the procedure well.

## 2016-06-15 NOTE — H&P (Signed)
PREOPERATIVE H&P  Chief Complaint: Avascular necrosis LEFT HIP  HPI: Leroy Hebert is a 41 y.o. male who presents for preoperative history and physical with a diagnosis of avascular necrosis LEFT HIP. Symptoms are rated as moderate to severe, and have been worsening.  This is significantly impairing activities of daily living.  He has elected for surgical management.   He has failed injections, activity modification, anti-inflammatories, and assistive devices.  Preoperative X-rays demonstrate end stage degenerative changes with osteophyte formation, loss of joint space, collapse of the femoral head, and subchondral sclerosis.   Past Medical History:  Diagnosis Date  . Anxiety   . Depression   . Hernia, hiatal   . Hypertension 06/04/2016   pt no longer prescribed medication for HTN   Past Surgical History:  Procedure Laterality Date  . TONSILLECTOMY     Social History   Social History  . Marital status: Divorced    Spouse name: N/A  . Number of children: N/A  . Years of education: N/A   Social History Main Topics  . Smoking status: Former Smoker    Quit date: 05/04/2016  . Smokeless tobacco: Never Used  . Alcohol use No  . Drug use: No  . Sexual activity: Not Asked   Other Topics Concern  . None   Social History Narrative  . None   Family History  Problem Relation Age of Onset  . Lung cancer Mother     with mets  . Heart disease Father    Allergies  Allergen Reactions  . No Known Allergies    Prior to Admission medications   Medication Sig Start Date End Date Taking? Authorizing Provider  ibuprofen (ADVIL,MOTRIN) 200 MG tablet Take 600-800 mg by mouth 2 (two) times daily as needed for mild pain (DEPENDS ON PAIN IF TAKES 3-4 TABLETS).    Yes Historical Provider, MD  omeprazole (PRILOSEC) 20 MG capsule Take 20 mg by mouth daily.   Yes Historical Provider, MD  vortioxetine HBr (TRINTELLIX) 20 MG TABS Take 20 mg by mouth daily.   Yes Historical Provider,  MD  oxyCODONE-acetaminophen (PERCOCET/ROXICET) 5-325 MG per tablet Take 1 tablet by mouth every 6 (six) hours as needed. Patient not taking: Reported on 06/01/2016 04/18/13   Blanchie Dessert, MD     Positive ROS: All other systems have been reviewed and were otherwise negative with the exception of those mentioned in the HPI and as above.  Physical Exam: General: Alert, no acute distress Cardiovascular: No pedal edema Respiratory: No cyanosis, no use of accessory musculature GI: No organomegaly, abdomen is soft and non-tender Skin: No lesions in the area of chief complaint Neurologic: Sensation intact distally Psychiatric: Patient is competent for consent with normal mood and affect Lymphatic: No axillary or cervical lymphadenopathy  MUSCULOSKELETAL: Left hip range of motion 0-80 with 5 of internal and external rotation with significant exquisite pain in the groin with range of motion. EHL intact.  Assessment: Avascular necrosis LEFT HIP   Plan: Plan for Procedure(s): TOTAL HIP ARTHROPLASTY  The risks benefits and alternatives were discussed with the patient including but not limited to the risks of nonoperative treatment, versus surgical intervention including infection, bleeding, nerve injury, periprosthetic fracture, the need for revision surgery, dislocation, leg length discrepancy, blood clots, cardiopulmonary complications, morbidity, mortality, among others, and they were willing to proceed.     Johnny Bridge, MD Cell [phone]   06/15/2016 10:21 AM

## 2016-06-15 NOTE — Anesthesia Preprocedure Evaluation (Signed)
Anesthesia Evaluation  Patient identified by MRN, date of birth, ID band Patient awake    Reviewed: Allergy & Precautions, NPO status , Patient's Chart, lab work & pertinent test results  History of Anesthesia Complications Negative for: history of anesthetic complications  Airway Mallampati: II  TM Distance: >3 FB Neck ROM: Full    Dental no notable dental hx. (+) Dental Advisory Given   Pulmonary neg pulmonary ROS, former smoker,    Pulmonary exam normal        Cardiovascular hypertension, negative cardio ROS Normal cardiovascular exam     Neuro/Psych negative neurological ROS  negative psych ROS   GI/Hepatic Neg liver ROS, hiatal hernia,   Endo/Other  Morbid obesity  Renal/GU negative Renal ROS  negative genitourinary   Musculoskeletal negative musculoskeletal ROS (+)   Abdominal   Peds negative pediatric ROS (+)  Hematology negative hematology ROS (+)   Anesthesia Other Findings   Reproductive/Obstetrics negative OB ROS                             Anesthesia Physical Anesthesia Plan  ASA: III  Anesthesia Plan: General   Post-op Pain Management:    Induction: Intravenous  Airway Management Planned: Oral ETT  Additional Equipment:   Intra-op Plan:   Post-operative Plan: Extubation in OR  Informed Consent: I have reviewed the patients History and Physical, chart, labs and discussed the procedure including the risks, benefits and alternatives for the proposed anesthesia with the patient or authorized representative who has indicated his/her understanding and acceptance.   Dental advisory given  Plan Discussed with: CRNA, Anesthesiologist and Surgeon  Anesthesia Plan Comments:         Anesthesia Quick Evaluation

## 2016-06-15 NOTE — Transfer of Care (Signed)
Immediate Anesthesia Transfer of Care Note  Patient: Leroy Hebert  Procedure(s) Performed: Procedure(s): TOTAL HIP ARTHROPLASTY (Left)  Patient Location: PACU  Anesthesia Type:MAC and Spinal  Level of Consciousness: awake, alert , oriented and sedated  Airway & Oxygen Therapy: Patient Spontanous Breathing and Patient connected to face mask oxygen  Post-op Assessment: Report given to RN, Post -op Vital signs reviewed and stable and Patient moving all extremities  Post vital signs: Reviewed and stable  Last Vitals:  Vitals:   06/15/16 1045 06/15/16 1430  BP: 134/87 112/75  Pulse: 63 75  Resp: 15 13  Temp:  36.6 C    Last Pain:  Vitals:   06/15/16 0915  TempSrc:   PainSc: 8       Patients Stated Pain Goal: 4 (65/46/50 3546)  Complications: No apparent anesthesia complications

## 2016-06-16 LAB — CBC
HCT: 35.2 % — ABNORMAL LOW (ref 39.0–52.0)
HEMOGLOBIN: 11.6 g/dL — AB (ref 13.0–17.0)
MCH: 26.2 pg (ref 26.0–34.0)
MCHC: 33 g/dL (ref 30.0–36.0)
MCV: 79.6 fL (ref 78.0–100.0)
Platelets: 133 10*3/uL — ABNORMAL LOW (ref 150–400)
RBC: 4.42 MIL/uL (ref 4.22–5.81)
RDW: 13.5 % (ref 11.5–15.5)
WBC: 14.2 10*3/uL — AB (ref 4.0–10.5)

## 2016-06-16 LAB — BASIC METABOLIC PANEL
ANION GAP: 7 (ref 5–15)
BUN: 11 mg/dL (ref 6–20)
CHLORIDE: 101 mmol/L (ref 101–111)
CO2: 27 mmol/L (ref 22–32)
CREATININE: 0.72 mg/dL (ref 0.61–1.24)
Calcium: 8.6 mg/dL — ABNORMAL LOW (ref 8.9–10.3)
GFR calc non Af Amer: >60 mL/min (ref >60)
Glucose, Bld: 145 mg/dL — ABNORMAL HIGH (ref 65–99)
Potassium: 4.4 mmol/L (ref 3.5–5.1)
SODIUM: 135 mmol/L (ref 135–145)

## 2016-06-16 MED ORDER — HYDROMORPHONE HCL 2 MG PO TABS: 2.0000 mg | ORAL_TABLET | ORAL | 0 refills | Status: DC | PRN

## 2016-06-16 NOTE — Progress Notes (Signed)
Pt discharged home with a rolling walker and a 3in1. Went over discharge instructions with pt and his wife with no concerns voiced. AVS and discharge scripts given before leaving unit

## 2016-06-16 NOTE — Discharge Instructions (Signed)
INSTRUCTIONS AFTER JOINT REPLACEMENT  ° °o Remove items at home which could result in a fall. This includes throw rugs or furniture in walking pathways °o ICE to the affected joint every three hours while awake for 30 minutes at a time, for at least the first 3-5 days, and then as needed for pain and swelling.  Continue to use ice for pain and swelling. You may notice swelling that will progress down to the foot and ankle.  This is normal after surgery.  Elevate your leg when you are not up walking on it.   °o Continue to use the breathing machine you got in the hospital (incentive spirometer) which will help keep your temperature down.  It is common for your temperature to cycle up and down following surgery, especially at night when you are not up moving around and exerting yourself.  The breathing machine keeps your lungs expanded and your temperature down. ° ° °DIET:  As you were doing prior to hospitalization, we recommend a well-balanced diet. ° °DRESSING / WOUND CARE / SHOWERING ° °You may change your dressing 3-5 days after surgery.  Then change the dressing every day with sterile gauze.  Please use good hand washing techniques before changing the dressing.  Do not use any lotions or creams on the incision until instructed by your surgeon. ° °ACTIVITY ° °o Increase activity slowly as tolerated, but follow the weight bearing instructions below.   °o No driving for 6 weeks or until further direction given by your physician.  You cannot drive while taking narcotics.  °o No lifting or carrying greater than 10 lbs. until further directed by your surgeon. °o Avoid periods of inactivity such as sitting longer than an hour when not asleep. This helps prevent blood clots.  °o You may return to work once you are authorized by your doctor.  ° ° ° °WEIGHT BEARING  ° °Weight bearing as tolerated with assist device (walker, cane, etc) as directed, use it as long as suggested by your surgeon or therapist, typically at  least 4-6 weeks. ° ° °EXERCISES ° °Results after joint replacement surgery are often greatly improved when you follow the exercise, range of motion and muscle strengthening exercises prescribed by your doctor. Safety measures are also important to protect the joint from further injury. Any time any of these exercises cause you to have increased pain or swelling, decrease what you are doing until you are comfortable again and then slowly increase them. If you have problems or questions, call your caregiver or physical therapist for advice.  ° °Rehabilitation is important following a joint replacement. After just a few days of immobilization, the muscles of the leg can become weakened and shrink (atrophy).  These exercises are designed to build up the tone and strength of the thigh and leg muscles and to improve motion. Often times heat used for twenty to thirty minutes before working out will loosen up your tissues and help with improving the range of motion but do not use heat for the first two weeks following surgery (sometimes heat can increase post-operative swelling).  ° °These exercises can be done on a training (exercise) mat, on the floor, on a table or on a bed. Use whatever works the best and is most comfortable for you.    Use music or television while you are exercising so that the exercises are a pleasant break in your day. This will make your life better with the exercises acting as a break   in your routine that you can look forward to.   Perform all exercises about fifteen times, three times per day or as directed.  You should exercise both the operative leg and the other leg as well. ° °Exercises include: °  °• Quad Sets - Tighten up the muscle on the front of the thigh (Quad) and hold for 5-10 seconds.   °• Straight Leg Raises - With your knee straight (if you were given a brace, keep it on), lift the leg to 60 degrees, hold for 3 seconds, and slowly lower the leg.  Perform this exercise against  resistance later as your leg gets stronger.  °• Leg Slides: Lying on your back, slowly slide your foot toward your buttocks, bending your knee up off the floor (only go as far as is comfortable). Then slowly slide your foot back down until your leg is flat on the floor again.  °• Angel Wings: Lying on your back spread your legs to the side as far apart as you can without causing discomfort.  °• Hamstring Strength:  Lying on your back, push your heel against the floor with your leg straight by tightening up the muscles of your buttocks.  Repeat, but this time bend your knee to a comfortable angle, and push your heel against the floor.  You may put a pillow under the heel to make it more comfortable if necessary.  ° °A rehabilitation program following joint replacement surgery can speed recovery and prevent re-injury in the future due to weakened muscles. Contact your doctor or a physical therapist for more information on knee rehabilitation.  ° ° °CONSTIPATION ° °Constipation is defined medically as fewer than three stools per week and severe constipation as less than one stool per week.  Even if you have a regular bowel pattern at home, your normal regimen is likely to be disrupted due to multiple reasons following surgery.  Combination of anesthesia, postoperative narcotics, change in appetite and fluid intake all can affect your bowels.  ° °YOU MUST use at least one of the following options; they are listed in order of increasing strength to get the job done.  They are all available over the counter, and you may need to use some, POSSIBLY even all of these options:   ° °Drink plenty of fluids (prune juice may be helpful) and high fiber foods °Colace 100 mg by mouth twice a day  °Senokot for constipation as directed and as needed Dulcolax (bisacodyl), take with full glass of water  °Miralax (polyethylene glycol) once or twice a day as needed. ° °If you have tried all these things and are unable to have a bowel  movement in the first 3-4 days after surgery call either your surgeon or your primary doctor.   ° °If you experience loose stools or diarrhea, hold the medications until you stool forms back up.  If your symptoms do not get better within 1 week or if they get worse, check with your doctor.  If you experience "the worst abdominal pain ever" or develop nausea or vomiting, please contact the office immediately for further recommendations for treatment. ° ° °ITCHING:  If you experience itching with your medications, try taking only a single pain pill, or even half a pain pill at a time.  You can also use Benadryl over the counter for itching or also to help with sleep.  ° °TED HOSE STOCKINGS:  Use stockings on both legs until for at least 2 weeks or as   directed by physician office. They may be removed at night for sleeping. ° °MEDICATIONS:  See your medication summary on the “After Visit Summary” that nursing will review with you.  You may have some home medications which will be placed on hold until you complete the course of blood thinner medication.  It is important for you to complete the blood thinner medication as prescribed. ° °PRECAUTIONS:  If you experience chest pain or shortness of breath - call 911 immediately for transfer to the hospital emergency department.  ° °If you develop a fever greater that 101 F, purulent drainage from wound, increased redness or drainage from wound, foul odor from the wound/dressing, or calf pain - CONTACT YOUR SURGEON.   °                                                °FOLLOW-UP APPOINTMENTS:  If you do not already have a post-op appointment, please call the office for an appointment to be seen by your surgeon.  Guidelines for how soon to be seen are listed in your “After Visit Summary”, but are typically between 1-4 weeks after surgery. ° °OTHER INSTRUCTIONS:  ° °Knee Replacement:  Do not place pillow under knee, focus on keeping the knee straight while resting. CPM  instructions: 0-90 degrees, 2 hours in the morning, 2 hours in the afternoon, and 2 hours in the evening. Place foam block, curve side up under heel at all times except when in CPM or when walking.  DO NOT modify, tear, cut, or change the foam block in any way. ° °MAKE SURE YOU:  °• Understand these instructions.  °• Get help right away if you are not doing well or get worse.  ° ° °Thank you for letting us be a part of your medical care team.  It is a privilege we respect greatly.  We hope these instructions will help you stay on track for a fast and full recovery!  ° °Information on my medicine - XARELTO® (Rivaroxaban) ° ° °Why was Xarelto® prescribed for you? °Xarelto® was prescribed for you to reduce the risk of blood clots forming after orthopedic surgery. The medical term for these abnormal blood clots is venous thromboembolism (VTE). ° °What do you need to know about xarelto® ? °Take your Xarelto® ONCE DAILY at the same time every day. °You may take it either with or without food. ° °If you have difficulty swallowing the tablet whole, you may crush it and mix in applesauce just prior to taking your dose. ° °Take Xarelto® exactly as prescribed by your doctor and DO NOT stop taking Xarelto® without talking to the doctor who prescribed the medication.  Stopping without other VTE prevention medication to take the place of Xarelto® may increase your risk of developing a clot. ° °After discharge, you should have regular check-up appointments with your healthcare provider that is prescribing your Xarelto®.   ° °What do you do if you miss a dose? °If you miss a dose, take it as soon as you remember on the same day then continue your regularly scheduled once daily regimen the next day. Do not take two doses of Xarelto® on the same day.  ° °Important Safety Information °A possible side effect of Xarelto® is bleeding. You should call your healthcare provider right away if you experience any of the following: °?   Bleeding  from an injury or your nose that does not stop. °? Unusual colored urine (red or dark brown) or unusual colored stools (red or black). °? Unusual bruising for unknown reasons. °? A serious fall or if you hit your head (even if there is no bleeding). ° °Some medicines may interact with Xarelto® and might increase your risk of bleeding while on Xarelto®. To help avoid this, consult your healthcare provider or pharmacist prior to using any new prescription or non-prescription medications, including herbals, vitamins, non-steroidal anti-inflammatory drugs (NSAIDs) and supplements. ° °This website has more information on Xarelto®: www.xarelto.com. ° ° °

## 2016-06-16 NOTE — Anesthesia Postprocedure Evaluation (Signed)
Anesthesia Post Note  Patient: Leroy Hebert  Procedure(s) Performed: Procedure(s) (LRB): TOTAL HIP ARTHROPLASTY (Left)  Patient location during evaluation: PACU Anesthesia Type: MAC and Spinal Level of consciousness: oriented and awake and alert Pain management: pain level controlled Vital Signs Assessment: post-procedure vital signs reviewed and stable Respiratory status: spontaneous breathing, respiratory function stable and patient connected to nasal cannula oxygen Cardiovascular status: blood pressure returned to baseline and stable Postop Assessment: no headache and no backache Anesthetic complications: no       Last Vitals:  Vitals:   06/16/16 0231 06/16/16 0454  BP: 119/64 134/73  Pulse: (!) 50 65  Resp: 17 17  Temp: 36.4 C 36.6 C    Last Pain:  Vitals:   06/16/16 0809  TempSrc:   PainSc: 9                  Karrisa Didio Brycin

## 2016-06-16 NOTE — Discharge Summary (Signed)
Physician Discharge Summary  Patient ID: Leroy Hebert MRN: [id number] DOB/AGE: 1975/03/21 41 y.o.  Admit date: 06/15/2016 Discharge date: 06/16/2016  Admission Diagnoses:  Avascular necrosis of bone of left hip [facility])  Discharge Diagnoses:  Principal Problem:   Avascular necrosis of bone of left hip [facility])   Past Medical History:  Diagnosis Date  . Anxiety   . Avascular necrosis of bone of right hip ([place]) 06/15/2016  . Depression   . Hernia, hiatal   . Hypertension 06/04/2016   pt no longer prescribed medication for HTN    Surgeries: Procedure(s): TOTAL HIP ARTHROPLASTY on 06/15/2016   Consultants (if any):   Discharged Condition: Improved  Hospital Course: Leroy Hebert is an 41 y.o. male who was admitted 06/15/2016 with a diagnosis of Avascular necrosis of bone of left hip ([place]) and went to the operating room on 06/15/2016 and underwent the above named procedures.    He was given perioperative antibiotics:  Anti-infectives    Start     Dose/Rate Route Frequency Ordered Stop   06/15/16 1800  ceFAZolin (ANCEF) IVPB 2g/100 mL premix     2 g 200 mL/hr over 30 Minutes Intravenous Every 6 hours 06/15/16 1430 06/16/16 0301   06/15/16 0600  ceFAZolin (ANCEF) 3 g in dextrose 5 % 50 mL IVPB     3 g 130 mL/hr over 30 Minutes Intravenous On call to O.R. 06/14/16 1312 06/15/16 1138    .  He was given sequential compression devices, early ambulation, and xarelto for DVT prophylaxis.  He benefited maximally from the hospital stay and there were no complications.    Recent vital signs:  Vitals:   06/16/16 0231 06/16/16 0454  BP: 119/64 134/73  Pulse: (!) 50 65  Resp: 17 17  Temp: 97.6 F (36.4 C) 97.8 F (36.6 C)    Recent laboratory studies:  Lab Results  Component Value Date   HGB 11.6 (L) 06/16/2016   HGB 15.5 06/04/2016   HGB 15.0 04/18/2013   Lab Results  Component Value Date   WBC 14.2 (H) 06/16/2016   PLT 133 (L) 06/16/2016   No results found for:  INR Lab Results  Component Value Date   NA 135 06/16/2016   K 4.4 06/16/2016   CL 101 06/16/2016   CO2 27 06/16/2016   BUN 11 06/16/2016   CREATININE 0.72 06/16/2016   GLUCOSE 145 (H) 06/16/2016    Discharge Medications:   Allergies as of 06/16/2016      Reactions   No Known Allergies       Medication List    STOP taking these medications   ibuprofen 200 MG tablet Commonly known as:  ADVIL,MOTRIN   oxyCODONE-acetaminophen 5-325 MG tablet Commonly known as:  PERCOCET/ROXICET Replaced by:  oxyCODONE-acetaminophen 10-325 MG tablet     TAKE these medications   baclofen 10 MG tablet Commonly known as:  LIORESAL Take 1 tablet (10 mg total) by mouth 3 (three) times daily. As needed for muscle spasm   HYDROmorphone 2 MG tablet Commonly known as:  DILAUDID Take 1 tablet (2 mg total) by mouth every 4 (four) hours as needed for severe pain.   omeprazole 20 MG capsule Commonly known as:  PRILOSEC Take 20 mg by mouth daily.   ondansetron 4 MG tablet Commonly known as:  ZOFRAN Take 1 tablet (4 mg total) by mouth every 8 (eight) hours as needed for nausea or vomiting.   oxyCODONE-acetaminophen 10-325 MG tablet Commonly known as:  PERCOCET  Take 1-2 tablets by mouth every 6 (six) hours as needed for pain. MAXIMUM TOTAL ACETAMINOPHEN DOSE IS 4000 MG PER DAY Replaces:  oxyCODONE-acetaminophen 5-325 MG tablet   rivaroxaban 10 MG Tabs tablet Commonly known as:  XARELTO Take 1 tablet (10 mg total) by mouth daily.   sennosides-docusate sodium 8.6-50 MG tablet Commonly known as:  SENOKOT-S Take 2 tablets by mouth daily.   TRINTELLIX 20 MG Tabs Generic drug:  vortioxetine HBr Take 20 mg by mouth daily.       Diagnostic Studies: Dg Hip Port Unilat With Pelvis 1v Left  Result Date: 06/15/2016 CLINICAL DATA:  Status post left total hip replacement today. EXAM: DG HIP (WITH OR WITHOUT PELVIS) 1V PORT LEFT COMPARISON:  CT abdomen and pelvis 03/24/2016. FINDINGS: A new left total  hip arthroplasty is in place. The device is located. There is no fracture. Mild joint space narrowing right hip is noted. IMPRESSION: Status post left hip replacement.  No acute abnormality. Electronically Signed   By: Inge Rise M.D.   On: 06/15/2016 15:55    Disposition: 01-Home or Self Care    Follow-up Information    Roniya Tetro P, MD. Schedule an appointment as soon as possible for a visit in 2 weeks.   Specialty:  Orthopedic Surgery Contact information: [place] [phone]            Signed: Johnny Bridge 06/16/2016, 8:25 AM

## 2016-06-16 NOTE — Progress Notes (Signed)
Physical Therapy Treatment Patient Details Name: Leroy Hebert MRN: [id number] DOB: 1975-09-18 Today's Date: 06/16/2016    History of Present Illness Pt presents for L THA, posterior approach, due to avascular necrosis of hip. PMH: anxiety, depression, HTN    PT Comments    Pt showed slightly more safety awareness during second session but this will still be his highest risk when he gets home. He is aware of this as is his fiancee. Practiced stairs as well as 100' ambulation with RW and supervision. PT will continue to follow.    Follow Up Recommendations  Home health PT     Equipment Recommendations  Rolling walker with 5" wheels;3in1 (PT)    Recommendations for Other Services       Precautions / Restrictions Precautions Precautions: Posterior Hip Precaution Booklet Issued: No Precaution Comments: pt recalled 2/3 precautions, needed vc's for hip flex Restrictions Weight Bearing Restrictions: Yes LLE Weight Bearing: Weight bearing as tolerated    Mobility  Bed Mobility Overal bed mobility: Independent             General bed mobility comments: pt anle to get in and out of bed without physical assist  Transfers Overall transfer level: Needs assistance Equipment used: Rolling walker (2 wheeled) Transfers: Sit to/from Stand Sit to Stand: Supervision         General transfer comment: vc's to use RW and not try to stand without support at this point  Ambulation/Gait Ambulation/Gait assistance: Supervision Ambulation Distance (Feet): 100 Feet Assistive device: Rolling walker (2 wheeled) Gait Pattern/deviations: Step-through pattern;Decreased weight shift to left;Decreased stance time - left;Antalgic Gait velocity: decreased Gait velocity interpretation: Below normal speed for age/gender General Gait Details: vc's for staying within RW   Stairs Stairs: Yes   Stair Management: One rail Right;Step to pattern;Forwards Number of Stairs: 5 General stair  comments: vc's for sequencing and use of rail  Wheelchair Mobility    Modified Rankin (Stroke Patients Only)       Balance Overall balance assessment: No apparent balance deficits (not formally assessed)                                          Cognition Arousal/Alertness: Awake/alert Behavior During Therapy: Impulsive;WFL for tasks assessed/performed Overall Cognitive Status: Within Functional Limits for tasks assessed                                 General Comments: pt able to verbalize safety concerns and slows down with cues. Fiancee aware of need to remind him to slow down      Exercises Total Joint Exercises Ankle Circles/Pumps: AROM;Both;10 reps;Supine Quad Sets: AROM;Both;10 reps;Supine Gluteal Sets: AROM;Both;10 reps;Seated Heel Slides: AROM;Left;10 reps;Supine Hip ABduction/ADduction: AROM;Left;10 reps;Supine Long Arc Quad: AROM;Left;10 reps;Seated Marching in Standing: AROM;10 reps;Standing Standing Hip Extension: AROM;Left;10 reps;Standing    General Comments        Pertinent Vitals/Pain Pain Assessment: Faces Faces Pain Scale: Hurts whole lot Pain Location: L hip Pain Descriptors / Indicators: Aching;Sore Pain Intervention(s): Limited activity within patient's tolerance;Premedicated before session;Monitored during session    [place] expects to be discharged to:: Private residence Living Arrangements: Spouse/significant other Available Help at Discharge: Family;Available 24 hours/day Type of Home: House Home Access: Stairs to enter Entrance Stairs-Rails: Right Home Layout: One level Home Equipment: None  Prior Function Level of Independence: Independent      Comments: 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 Lots of driving   PT Goals (current goals can now be found in the care plan section) Acute Rehab PT Goals Patient Stated Goal: return to home  and work PT Goal Formulation: With patient Time For Goal Achievement: 06/23/16 Potential to Achieve Goals: Good Progress towards PT goals: Progressing toward goals    Frequency    7X/week      PT Plan Current plan remains appropriate    Co-evaluation              AM-PAC PT "6 Clicks" Daily Activity  Outcome Measure  Difficulty turning over in bed (including adjusting bedclothes, sheets and blankets)?: None Difficulty moving from lying on back to sitting on the side of the bed? : None Difficulty sitting down on and standing up from a chair with arms (e.g., wheelchair, bedside commode, etc,.)?: A Little Help needed moving to and from a bed to chair (including a wheelchair)?: A Little Help needed walking in hospital room?: A Little Help needed climbing 3-5 steps with a railing? : A Little 6 Click Score: 20    End of Session Equipment Utilized During Treatment: Gait belt Activity Tolerance: Patient tolerated treatment well Patient left: in chair;with call bell/phone within reach;with family/visitor present Nurse Communication: Mobility status PT Visit Diagnosis: Pain;Other abnormalities of gait and mobility (R26.89) Pain - Right/Left: Left Pain - part of body: Leg     Time: 1594-7076 PT Time Calculation (min) (ACUTE ONLY): 31 min  Charges:  $Gait Training: 8-22 mins $Therapeutic Exercise: 8-22 mins                    G Codes:       Leighton Roach, PT  Acute Rehab Services  [place] 06/16/2016, 12:54 PM

## 2016-06-16 NOTE — Evaluation (Signed)
Physical Therapy Evaluation Patient Details Name: Leroy Hebert MRN: [id number] DOB: 1975/06/26 Today's Date: 06/16/2016   History of Present Illness  Pt presents for L THA, posterior approach, due to avascular necrosis of hip. PMH: anxiety, depression, HTN  Clinical Impression  Pt is s/p THA resulting in the deficits listed below (see PT Problem List). Pt mobilized well first session, ambukalted 66' with RW and min-guard A. Tends to be impulsive though and needs frequent safety cues. Of note, was standing EOB when PT entered with IV line over bed, O2 around arm, abduction pillow and SCD still strapped to legs. Verbalized need to ask for help but reinforced this with him.  Pt will benefit from skilled PT to increase their independence and safety with mobility to allow discharge to the venue listed below.      Follow Up Recommendations Home health PT    Equipment Recommendations  Rolling walker with 5" wheels;3in1 (PT)    Recommendations for Other Services       Precautions / Restrictions Precautions Precautions: Posterior Hip Precaution Booklet Issued: Yes (comment) Restrictions Weight Bearing Restrictions: Yes LLE Weight Bearing: Weight bearing as tolerated      Mobility  Bed Mobility               General bed mobility comments: independently to EOB  Transfers Overall transfer level: Needs assistance Equipment used: Rolling walker (2 wheeled) Transfers: Sit to/from Stand Sit to Stand: Supervision         General transfer comment: vc's for hand placement with use of RW  Ambulation/Gait Ambulation/Gait assistance: Min guard Ambulation Distance (Feet): 40 Feet Assistive device: Rolling walker (2 wheeled) Gait Pattern/deviations: Step-through pattern;Decreased weight shift to left;Decreased stance time - left Gait velocity: decreased Gait velocity interpretation: Below normal speed for age/gender General Gait Details: vc's for safety with RW, staying  within RW and precautions with turning.   Stairs            Wheelchair Mobility    Modified Rankin (Stroke Patients Only)       Balance Overall balance assessment: No apparent balance deficits (not formally assessed)                                           Pertinent Vitals/Pain Pain Assessment: Faces Faces Pain Scale: Hurts even more Pain Location: L hip and buttocks Pain Descriptors / Indicators: Aching;Sore Pain Intervention(s): Limited activity within patient's tolerance;Monitored during session;Premedicated before session;Repositioned         O2 sats 99% on RA  Home Living Family/patient expects to be discharged to:: Private residence Living Arrangements: Spouse/significant other Available Help at Discharge: Family;Available 24 hours/day Type of Home: House Home Access: Stairs to enter Entrance Stairs-Rails: Right Entrance Stairs-Number of Steps: 7 Home Layout: One level Home Equipment: None      Prior Function Level of Independence: Independent         Comments: 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 Lots of driving     Hand Dominance        Extremity/Trunk Assessment   Upper Extremity Assessment Upper Extremity Assessment: Overall WFL for tasks assessed    Lower Extremity Assessment Lower Extremity Assessment: LLE deficits/detail LLE Deficits / Details: hip flex 3-/5, knee ext 3/5, knee flex 3/5, ankle WFL    Cervical / Trunk Assessment Cervical / Trunk Assessment:  Normal  Communication   Communication: No difficulties  Cognition Arousal/Alertness: Awake/alert Behavior During Therapy: Impulsive;WFL for tasks assessed/performed Overall Cognitive Status: Within Functional Limits for tasks assessed                                 General Comments: pt received standing EOB with IV line across bed, abduction pillow between legs, and SCD on. He reported that he knew it  was not safe and knew he should get help but just had to move because of pain. Education given for safety awareness      General Comments      Exercises Total Joint Exercises Ankle Circles/Pumps: AROM;Both;10 reps;Seated Quad Sets: AROM;Both;10 reps;Seated Gluteal Sets: AROM;Both;10 reps;Seated Long Arc Quad: AROM;Left;10 reps;Seated   Assessment/Plan    PT Assessment Patient needs continued PT services  PT Problem List Decreased strength;Decreased range of motion;Decreased activity tolerance;Decreased mobility;Decreased knowledge of use of DME;Decreased knowledge of precautions;Decreased safety awareness;Pain       PT Treatment Interventions DME instruction;Gait training;Stair training;Functional mobility training;Therapeutic activities;Therapeutic exercise;Patient/family education    PT Goals (Current goals can be found in the Care Plan section)  Acute Rehab PT Goals Patient Stated Goal: return to home and work PT Goal Formulation: With patient Time For Goal Achievement: 06/23/16 Potential to Achieve Goals: Good    Frequency 7X/week   Barriers to discharge        Co-evaluation               AM-PAC PT "6 Clicks" Daily Activity  Outcome Measure Difficulty turning over in bed (including adjusting bedclothes, sheets and blankets)?: None Difficulty moving from lying on back to sitting on the side of the bed? : None Difficulty sitting down on and standing up from a chair with arms (e.g., wheelchair, bedside commode, etc,.)?: A Little Help needed moving to and from a bed to chair (including a wheelchair)?: A Little Help needed walking in hospital room?: A Little Help needed climbing 3-5 steps with a railing? : A Little 6 Click Score: 20    End of Session Equipment Utilized During Treatment: Gait belt Activity Tolerance: Patient tolerated treatment well Patient left: in chair;with call bell/phone within reach;with family/visitor present Nurse Communication: Mobility  status PT Visit Diagnosis: Pain;Other abnormalities of gait and mobility (R26.89) Pain - Right/Left: Left Pain - part of body: Leg    Time: 1700-1749 PT Time Calculation (min) (ACUTE ONLY): 24 min   Charges:   PT Evaluation $PT Eval Low Complexity: 1 Procedure PT Treatments $Gait Training: 8-22 mins   PT G Codes:        Leighton Roach, PT  Acute Rehab Services  [place] 06/16/2016, 9:31 AM

## 2016-06-17 ENCOUNTER — Encounter (HOSPITAL_COMMUNITY): Payer: Self-pay | Admitting: Orthopedic Surgery

## 2016-06-17 NOTE — OR Nursing (Signed)
LATE ENTRY: Added implant to implant section of OR record.  Verified entry with PO paperwork signed by Firefighter.

## 2016-08-10 ENCOUNTER — Encounter: Payer: Self-pay | Admitting: Gastroenterology

## 2016-08-10 ENCOUNTER — Ambulatory Visit (INDEPENDENT_AMBULATORY_CARE_PROVIDER_SITE_OTHER): Payer: BLUE CROSS/BLUE SHIELD | Admitting: Gastroenterology

## 2016-08-10 ENCOUNTER — Encounter (INDEPENDENT_AMBULATORY_CARE_PROVIDER_SITE_OTHER): Payer: Self-pay

## 2016-08-10 VITALS — BP 120/78 | HR 80 | Ht 73.0 in | Wt 273.0 lb

## 2016-08-10 DIAGNOSIS — R1115 Cyclical vomiting syndrome unrelated to migraine: Secondary | ICD-10-CM

## 2016-08-10 DIAGNOSIS — G43A0 Cyclical vomiting, not intractable: Secondary | ICD-10-CM | POA: Diagnosis not present

## 2016-08-10 MED ORDER — PROCHLORPERAZINE 25 MG RE SUPP: 25.0000 mg | Freq: Two times a day (BID) | RECTAL | 1 refills | Status: DC | PRN

## 2016-08-10 NOTE — Progress Notes (Signed)
[facility] Consult Note:  History: Leroy Hebert 08/10/2016  Referring physician: Dennard Nip, NP [facility], [place], [place])  Reason for consult/chief complaint: nasuea and vomiting (intermittent episodes, getting worse over the years ) and Gastroesophageal Reflux (hiatal hernia )   Subjective  HPI:  This is a 41 year old man referred by primary care noted above for over 20 years of intermittent nausea and vomiting. He recalls having seen a GI doctor in the past and having an upper endoscopy and what sounds like a gastric emptying study. He believes the GES was normal, and thinks there may have been some "ulcers" on the EGD. His symptom pattern has been the same for over 20 years, but seems to be getting worse in the last couple of years. He has unpredictable acute onset nausea followed 10 or 15 minutes later by intense and protracted vomiting that will be quite forceful by his report. They will be severe associated belching and intermittent vomiting that will go on for perhaps 12 hours. He will then fill completely wiped out, be unable to work or sleep until it finally subsides. After a total period of 18-24 hours he feels back to himself, and will be well until the next episode occurs. It is not apparently triggered by any particular foods. He describes a one day he had a hot dog, fries and chilly for lunch and felt just fine, but after just a few bites of [place] food that evening he had an episode. He does admit that he tends to eat quite quickly, saying that he could have a biscuit and just 2 bites. He therefore has no dysphagia or odynophagia. His appetite is good and has been no weight loss. Bowel habits are regular without rectal bleeding. The PCP referral indicates that a gallbladder ultrasound was "negative". No report accompanies that. He is still concern that perhaps his gallbladder may be the cause of the symptoms. He reports that there is no  pain with these episodes until he has vomited forcefully many times, then there is some upper abdominal wall soreness. Lastly, he feels that the symptoms are better if he is able to stay on omeprazole daily. Other PPIs do not seem to help as much.  ROS:  Review of Systems  Constitutional: Negative for appetite change and unexpected weight change.  HENT: Negative for mouth sores and voice change.   Eyes: Negative for pain and redness.  Respiratory: Negative for cough and shortness of breath.   Cardiovascular: Negative for chest pain and palpitations.  Genitourinary: Negative for dysuria and hematuria.  Musculoskeletal: Negative for arthralgias and myalgias.       He has left hip pain since he is recovering from a hip replacement for avascular necrosis done in early May.  Skin: Negative for pallor and rash.  Neurological: Negative for weakness and headaches.  Hematological: Negative for adenopathy.     Past Medical History: Past Medical History:  Diagnosis Date  . Anxiety   . Avascular necrosis of bone of right hip ([place]) 06/15/2016  . Depression   . Hernia, hiatal   . Hypertension 06/04/2016   pt no longer prescribed medication for HTN     Past Surgical History: Past Surgical History:  Procedure Laterality Date  . TONSILLECTOMY    . TOTAL HIP ARTHROPLASTY Left 06/15/2016   Procedure: TOTAL HIP ARTHROPLASTY;  Surgeon: Marchia Bond, MD;  Location: [facility];  Service: Orthopedics;  Laterality: Left;     Family History: Family History  Problem Relation Age of Onset  . Lung cancer Mother        with mets  . Heart disease Father   . Colon cancer Neg Hx   . Stomach cancer Neg Hx     Social History: Social History   Social History  . Marital status: Divorced    Spouse name: N/A  . Number of children: 2  . Years of education: N/A   Social History Main Topics  . Smoking status: Former Smoker    Quit date: 05/04/2016  . Smokeless tobacco: Never Used  . Alcohol use No  .  Drug use: No  . Sexual activity: Yes    Partners: Female   Other Topics Concern  . None   Social History Narrative   Single       2 girls       Nature conservation officer     Allergies: Allergies  Allergen Reactions  . No Known Allergies     Outpatient Meds: Current Outpatient Prescriptions  Medication Sig Dispense Refill  . omeprazole (PRILOSEC) 20 MG capsule Take 20 mg by mouth daily.    . ondansetron (ZOFRAN) 4 MG tablet Take 1 tablet (4 mg total) by mouth every 8 (eight) hours as needed for nausea or vomiting. 30 tablet 0  . vortioxetine HBr (TRINTELLIX) 20 MG TABS Take 20 mg by mouth daily.    . prochlorperazine (COMPAZINE) 25 MG suppository Place 1 suppository (25 mg total) rectally every 12 (twelve) hours as needed for nausea or vomiting. 6 suppository 1   No current facility-administered medications for this visit.       ___________________________________________________________________ Objective   Exam:  BP 120/78   Pulse 80   Ht 6\' 1"  (1.854 m)   Wt 273 lb (123.8 kg)   BMI 36.02 kg/m    General: this is a(n) Pleasant and anxious-appearing, fidgety, overweight and otherwise well-appearing man   Eyes: sclera anicteric, no redness  ENT: oral mucosa moist without lesions, no cervical or supraclavicular lymphadenopathy, good dentition  CV: RRR without murmur, S1/S2, no JVD, no peripheral edema  Resp: clear to auscultation bilaterally, normal RR and effort noted  GI: soft, no tenderness, with active bowel sounds. No guarding or palpable organomegaly noted.  Skin; warm and dry, no rash or jaundice noted  Neuro: awake, alert and oriented x 3. Normal gross motor function and fluent speech  No data for review   Assessment: Encounter Diagnosis  Name Primary?  . Non-intractable cyclical vomiting with nausea Yes    This sounds like a probable gastric dysmotility. I think these would be an unusual presentation for gallbladder. Yield of a CCK-HIDA scan  seems likely to be low since the symptoms are not typical for gallbladder and intermittent.  Plan:  EGD to rule out obstructive or inflammatory causes.  The benefits and risks of the planned procedure were described in detail with the patient or (when appropriate) their health care proxy.  Risks were outlined as including, but not limited to, bleeding, infection, perforation, adverse medication reaction leading to cardiac or pulmonary decompensation, or pancreatitis (if ERCP).  The limitation of incomplete mucosal visualization was also discussed.  No guarantees or warranties were given.  I gave him a supply of Compazine suppositories to use for the next episode in hopes that it will decrease the severity and duration.  Thank you for the courtesy of this consult.  Please call me with any questions or concerns.  Nelida Meuse III  CC: Dennard Nip,  NP

## 2016-08-10 NOTE — Patient Instructions (Signed)
You have been scheduled for an endoscopy. Please follow written instructions given to you at your visit today. If you use inhalers (even only as needed), please bring them with you on the day of your procedure. Your physician has requested that you go to www.startemmi.com and enter the access code given to you at your visit today. This web site gives a general overview about your procedure. However, you should still follow specific instructions given to you by our office regarding your preparation for the procedure.  If you are age 60 or older, your body mass index should be between 23-30. Your Body mass index is 36.02 kg/m. If this is out of the aforementioned range listed, please consider follow up with your Primary Care Provider.  If you are age 92 or younger, your body mass index should be between 19-25. Your Body mass index is 36.02 kg/m. If this is out of the aformentioned range listed, please consider follow up with your Primary Care Provider.   Thank you for choosing [facility].  Dr. Wilfrid Lund

## 2016-08-19 ENCOUNTER — Encounter: Payer: BLUE CROSS/BLUE SHIELD | Admitting: Gastroenterology

## 2016-08-19 ENCOUNTER — Ambulatory Visit (AMBULATORY_SURGERY_CENTER): Payer: BLUE CROSS/BLUE SHIELD | Admitting: Gastroenterology

## 2016-08-19 ENCOUNTER — Encounter: Payer: Self-pay | Admitting: Gastroenterology

## 2016-08-19 VITALS — BP 118/73 | HR 65 | Temp 97.8°F | Resp 16 | Ht 72.0 in | Wt 273.0 lb

## 2016-08-19 DIAGNOSIS — R1115 Cyclical vomiting syndrome unrelated to migraine: Secondary | ICD-10-CM

## 2016-08-19 DIAGNOSIS — G43A0 Cyclical vomiting, not intractable: Secondary | ICD-10-CM

## 2016-08-19 MED ORDER — SODIUM CHLORIDE 0.9 % IV SOLN: 500.0000 mL | INTRAVENOUS | Status: DC

## 2016-08-19 NOTE — Patient Instructions (Signed)
Discharge instructions given. Normal exam. Resume previous medications. Office will call and schedule Gastri Emptying Study. YOU HAD AN ENDOSCOPIC PROCEDURE TODAY AT [facility]:   Refer to the procedure report that was given to you for any specific questions about what was found during the examination.  If the procedure report does not answer your questions, please call your gastroenterologist to clarify.  If you requested that your care partner not be given the details of your procedure findings, then the procedure report has been included in a sealed envelope for you to review at your convenience later.  YOU SHOULD EXPECT: Some feelings of bloating in the abdomen. Passage of more gas than usual.  Walking can help get rid of the air that was put into your GI tract during the procedure and reduce the bloating. If you had a lower endoscopy (such as a colonoscopy or flexible sigmoidoscopy) you may notice spotting of blood in your stool or on the toilet paper. If you underwent a bowel prep for your procedure, you may not have a normal bowel movement for a few days.  Please Note:  You might notice some irritation and congestion in your nose or some drainage.  This is from the oxygen used during your procedure.  There is no need for concern and it should clear up in a day or so.  SYMPTOMS TO REPORT IMMEDIATELY:    Following upper endoscopy (EGD)  Vomiting of blood or coffee ground material  New chest pain or pain under the shoulder blades  Painful or persistently difficult swallowing  New shortness of breath  Fever of 100F or higher  Black, tarry-looking stools  For urgent or emergent issues, a gastroenterologist can be reached at any hour by calling [phone].   DIET:  We do recommend a small meal at first, but then you may proceed to your regular diet.  Drink plenty of fluids but you should avoid alcoholic beverages for 24 hours.  ACTIVITY:  You should plan to take it  easy for the rest of today and you should NOT DRIVE or use heavy machinery until tomorrow (because of the sedation medicines used during the test).    FOLLOW UP: Our staff will call the number listed on your records the next business day following your procedure to check on you and address any questions or concerns that you may have regarding the information given to you following your procedure. If we do not reach you, we will leave a message.  However, if you are feeling well and you are not experiencing any problems, there is no need to return our call.  We will assume that you have returned to your regular daily activities without incident.  If any biopsies were taken you will be contacted by phone or by letter within the next 1-3 weeks.  Please call us at [phone] if you have not heard about the biopsies in 3 weeks.    SIGNATURES/CONFIDENTIALITY: You and/or your care partner have signed paperwork which will be entered into your electronic medical record.  These signatures attest to the fact that that the information above on your After Visit Summary has been reviewed and is understood.  Full responsibility of the confidentiality of this discharge information lies with you and/or your care-partner.

## 2016-08-19 NOTE — Progress Notes (Signed)
Dental advisory given to patientAlert and oriented x3, pleased with MAC, report to RN  

## 2016-08-19 NOTE — Op Note (Signed)
[facility] Patient Name: Leroy Hebert Procedure Date: 08/19/2016 9:57 AM MRN: [id number] Endoscopist: Mallie Mussel L. Loletha Carrow , MD Age: 41 Referring MD:  Date of Birth: [birth year] Gender: Male Account #: [id number] Procedure:                Upper GI endoscopy Indications:              Nausea with vomiting (20 yrs of symptoms, recent                            normal GB ultrasound) Medicines:                Monitored Anesthesia Care Procedure:                Pre-Anesthesia Assessment:                           - Prior to the procedure, a History and Physical                            was performed, and patient medications and                            allergies were reviewed. The patient's tolerance of                            previous anesthesia was also reviewed. The risks                            and benefits of the procedure and the sedation                            options and risks were discussed with the patient.                            All questions were answered, and informed consent                            was obtained. Prior Anticoagulants: The patient has                            taken no previous anticoagulant or antiplatelet                            agents. ASA Grade Assessment: II - A patient with                            mild systemic disease. After reviewing the risks                            and benefits, the patient was deemed in                            satisfactory condition to undergo the procedure.  After obtaining informed consent, the endoscope was                            passed under direct vision. Throughout the                            procedure, the patient's blood pressure, pulse, and                            oxygen saturations were monitored continuously. The                            Endoscope was introduced through the mouth, and                            advanced to the second part of duodenum.  The upper                            GI endoscopy was accomplished without difficulty.                            The patient tolerated the procedure well. Scope In: Scope Out: Findings:                 The esophagus was normal.                           The stomach was normal.                           The cardia and gastric fundus were normal on                            retroflexion.                           The examined duodenum was normal. Complications:            No immediate complications. Estimated Blood Loss:     Estimated blood loss: none. Impression:               - Normal esophagus.                           - Normal stomach.                           - Normal examined duodenum.                           - No specimens collected.                           The overall scenario is most consistent with cyclic                            vomiting syndrome. Recommendation:           -  Patient has a contact number available for                            emergencies. The signs and symptoms of potential                            delayed complications were discussed with the                            patient. Return to normal activities tomorrow.                            Written discharge instructions were provided to the                            patient.                           - Resume previous diet. However, make an effort to                            eat smaller portions (even if more frequently) and                            low fat.                           - Continue present medications.                           - 4 hour gastric emptying study Leroy Hebert L. Loletha Carrow, MD 08/19/2016 10:15:21 AM This report has been signed electronically.

## 2016-08-20 ENCOUNTER — Telehealth: Payer: Self-pay | Admitting: *Deleted

## 2016-08-20 NOTE — Telephone Encounter (Signed)
No answer, left message to call if questions or concerns. 

## 2016-08-20 NOTE — Telephone Encounter (Signed)
Second call. No answer, left message to call if questions or concerns. 

## 2016-08-20 NOTE — Telephone Encounter (Signed)
No answer, left message to call if any questions or concerns,

## 2017-11-15 ENCOUNTER — Emergency Department (HOSPITAL_COMMUNITY): Payer: BLUE CROSS/BLUE SHIELD

## 2017-11-15 ENCOUNTER — Emergency Department (HOSPITAL_COMMUNITY)
Admission: EM | Admit: 2017-11-15 | Discharge: 2017-11-15 | Disposition: A | Discharge status: Home / Self Care | Payer: BLUE CROSS/BLUE SHIELD | Attending: Emergency Medicine | Admitting: Emergency Medicine

## 2017-11-15 ENCOUNTER — Other Ambulatory Visit: Payer: Self-pay

## 2017-11-15 ENCOUNTER — Encounter (HOSPITAL_COMMUNITY): Payer: Self-pay

## 2017-11-15 DIAGNOSIS — Z79899 Other long term (current) drug therapy: Secondary | ICD-10-CM | POA: Diagnosis not present

## 2017-11-15 DIAGNOSIS — R1031 Right lower quadrant pain: Secondary | ICD-10-CM | POA: Diagnosis present

## 2017-11-15 DIAGNOSIS — F419 Anxiety disorder, unspecified: Secondary | ICD-10-CM | POA: Diagnosis not present

## 2017-11-15 DIAGNOSIS — Z87891 Personal history of nicotine dependence: Secondary | ICD-10-CM | POA: Insufficient documentation

## 2017-11-15 DIAGNOSIS — K429 Umbilical hernia without obstruction or gangrene: Secondary | ICD-10-CM

## 2017-11-15 DIAGNOSIS — Z96642 Presence of left artificial hip joint: Secondary | ICD-10-CM | POA: Insufficient documentation

## 2017-11-15 DIAGNOSIS — I10 Essential (primary) hypertension: Secondary | ICD-10-CM | POA: Diagnosis not present

## 2017-11-15 DIAGNOSIS — F329 Major depressive disorder, single episode, unspecified: Secondary | ICD-10-CM | POA: Insufficient documentation

## 2017-11-15 LAB — COMPREHENSIVE METABOLIC PANEL
ALK PHOS: 81 U/L (ref 38–126)
ALT: 50 U/L — AB (ref 0–44)
AST: 30 U/L (ref 15–41)
Albumin: 4.4 g/dL (ref 3.5–5.0)
Anion gap: 11 (ref 5–15)
BILIRUBIN TOTAL: 0.8 mg/dL (ref 0.3–1.2)
BUN: 21 mg/dL — ABNORMAL HIGH (ref 6–20)
CALCIUM: 9.3 mg/dL (ref 8.9–10.3)
CO2: 22 mmol/L (ref 22–32)
CREATININE: 0.96 mg/dL (ref 0.61–1.24)
Chloride: 106 mmol/L (ref 98–111)
GFR calc Af Amer: >60 mL/min (ref >60)
GFR calc non Af Amer: >60 mL/min (ref >60)
GLUCOSE: 143 mg/dL — AB (ref 70–99)
Potassium: 4.4 mmol/L (ref 3.5–5.1)
SODIUM: 139 mmol/L (ref 135–145)
Total Protein: 7.4 g/dL (ref 6.5–8.1)

## 2017-11-15 LAB — CBC
HCT: 48.3 % (ref 39.0–52.0)
Hemoglobin: 16.3 g/dL (ref 13.0–17.0)
MCH: 28.7 pg (ref 26.0–34.0)
MCHC: 33.7 g/dL (ref 30.0–36.0)
MCV: 85.2 fL (ref 78.0–100.0)
PLATELETS: 163 10*3/uL (ref 150–400)
RBC: 5.67 MIL/uL (ref 4.22–5.81)
RDW: 13.2 % (ref 11.5–15.5)
WBC: 7.4 10*3/uL (ref 4.0–10.5)

## 2017-11-15 LAB — URINALYSIS, ROUTINE W REFLEX MICROSCOPIC
BILIRUBIN URINE: NEGATIVE
Glucose, UA: NEGATIVE mg/dL
HGB URINE DIPSTICK: NEGATIVE
Ketones, ur: NEGATIVE mg/dL
Leukocytes, UA: NEGATIVE
Nitrite: NEGATIVE
PROTEIN: NEGATIVE mg/dL
Specific Gravity, Urine: 1.021 (ref 1.005–1.030)
pH: 5 (ref 5.0–8.0)

## 2017-11-15 LAB — LIPASE, BLOOD: Lipase: 35 U/L (ref 11–51)

## 2017-11-15 MED ORDER — SODIUM CHLORIDE 0.9 % IV BOLUS
1000.0000 mL | Freq: Once | INTRAVENOUS | Status: AC
  Administered 2017-11-15: 1000 mL via INTRAVENOUS

## 2017-11-15 MED ORDER — ONDANSETRON HCL 4 MG/2ML IJ SOLN
4.0000 mg | Freq: Once | INTRAMUSCULAR | Status: AC
  Administered 2017-11-15: 4 mg via INTRAVENOUS
  Filled 2017-11-15: qty 2

## 2017-11-15 MED ORDER — HYDROMORPHONE HCL 1 MG/ML IJ SOLN
1.0000 mg | Freq: Once | INTRAMUSCULAR | Status: AC
  Administered 2017-11-15: 1 mg via INTRAVENOUS
  Filled 2017-11-15: qty 1

## 2017-11-15 MED ORDER — SODIUM CHLORIDE 0.9 % IJ SOLN
INTRAMUSCULAR | Status: AC
  Filled 2017-11-15: qty 50

## 2017-11-15 MED ORDER — IOPAMIDOL (ISOVUE-300) INJECTION 61%
INTRAVENOUS | Status: AC
  Filled 2017-11-15: qty 100

## 2017-11-15 MED ORDER — IOPAMIDOL (ISOVUE-300) INJECTION 61%
100.0000 mL | Freq: Once | INTRAVENOUS | Status: AC | PRN
  Administered 2017-11-15: 100 mL via INTRAVENOUS

## 2017-11-15 NOTE — ED Provider Notes (Signed)
[facility] DEPT Provider Note   CSN: [id number] Arrival date & time: 11/15/17  4782     History   Chief Complaint Chief Complaint  Patient presents with  . Abdominal Pain    HPI Kimani Hovis II is a 42 y.o. male.  Patient c/o right lower and periumbilical pain onset this AM. Pain constant, dull, moderate, non radiating. States hx umbilical hernia for many years, states occasionally pain to area but that it usually goes down. Nausea. No vomiting. Having normal bms. No fever or chills. No dysuria/hematuria or gu c/o. No back or flank pain.   The history is provided by the patient.  Abdominal Pain   Pertinent negatives include fever, diarrhea, vomiting, dysuria and headaches.    Past Medical History:  Diagnosis Date  . Anxiety   . Avascular necrosis of bone of right hip ([place]) 06/15/2016  . Depression   . GERD (gastroesophageal reflux disease)   . Hernia, hiatal   . Hypertension 06/04/2016   pt no longer prescribed medication for HTN    Patient Active Problem List   Diagnosis Date Noted  . Avascular necrosis of bone of left hip ([place]) 06/15/2016    Past Surgical History:  Procedure Laterality Date  . TONSILLECTOMY    . TOTAL HIP ARTHROPLASTY Left 06/15/2016   Procedure: TOTAL HIP ARTHROPLASTY;  Surgeon: Marchia Bond, MD;  Location: [facility];  Service: Orthopedics;  Laterality: Left;        Home Medications    Prior to Admission medications   Medication Sig Start Date End Date Taking? Authorizing Provider  omeprazole (PRILOSEC) 20 MG capsule Take 20 mg by mouth daily.    [provider]  ondansetron (ZOFRAN) 4 MG tablet Take 1 tablet (4 mg total) by mouth every 8 (eight) hours as needed for nausea or vomiting. 06/15/16   Marchia Bond, MD  prochlorperazine (COMPAZINE) 25 MG suppository Place 1 suppository (25 mg total) rectally every 12 (twelve) hours as needed for nausea or vomiting. 08/10/16   Doran Stabler, MD    vortioxetine HBr (TRINTELLIX) 20 MG TABS Take 20 mg by mouth daily.    [provider]    Family History Family History  Problem Relation Age of Onset  . Lung cancer Mother        with mets  . Heart disease Father   . Colon cancer Neg Hx   . Stomach cancer Neg Hx     Social History Social History   Tobacco Use  . Smoking status: Former Smoker    Last attempt to quit: 05/04/2016    Years since quitting: 1.5  . Smokeless tobacco: Never Used  Substance Use Topics  . Alcohol use: No  . Drug use: No     Allergies   No known allergies   Review of Systems Review of Systems  Constitutional: Negative for fever.  HENT: Negative for sore throat.   Eyes: Negative for redness.  Respiratory: Negative for shortness of breath.   Cardiovascular: Negative for chest pain.  Gastrointestinal: Positive for abdominal pain. Negative for diarrhea and vomiting.  Endocrine: Negative for polyuria.  Genitourinary: Negative for dysuria and flank pain.  Musculoskeletal: Negative for back pain.  Skin: Negative for rash.  Neurological: Negative for headaches.  Hematological: Does not bruise/bleed easily.  Psychiatric/Behavioral: Negative for confusion.     Physical Exam Updated Vital Signs BP (!) 194/130 (BP Location: Right Arm)   Pulse 83   Temp (!) 97.5 F (36.4  C) (Oral)   Resp 20   Ht 1.854 m (6\' 1" )   Wt 117.9 kg   SpO2 100%   BMI 34.30 kg/m   Physical Exam  Constitutional: He appears well-developed and well-nourished.  HENT:  Mouth/Throat: Oropharynx is clear and moist.  Eyes: Conjunctivae are normal. No scleral icterus.  Neck: Neck supple. No tracheal deviation present.  Cardiovascular: Normal rate, regular rhythm, normal heart sounds and intact distal pulses.  Pulmonary/Chest: Effort normal and breath sounds normal. No accessory muscle usage. No respiratory distress.  Abdominal: Soft. Bowel sounds are normal. He exhibits no distension. There is tenderness. There  is no rebound and no guarding. A hernia is present.  Umbilical hernia, mildly tender. Moderate RLQ tenderness.   Genitourinary:  Genitourinary Comments: No cva tenderness  Musculoskeletal: He exhibits no edema.  Neurological: He is alert.  Skin: Skin is warm and dry.  Psychiatric: He has a normal mood and affect.  Nursing note and vitals reviewed.    ED Treatments / Results  Labs (all labs ordered are listed, but only abnormal results are displayed) Results for orders placed or performed during the hospital encounter of 11/15/17  Lipase, blood  Result Value Ref Range   Lipase 35 11 - 51 U/L  Comprehensive metabolic panel  Result Value Ref Range   Sodium 139 135 - 145 mmol/L   Potassium 4.4 3.5 - 5.1 mmol/L   Chloride 106 98 - 111 mmol/L   CO2 22 22 - 32 mmol/L   Glucose, Bld 143 (H) 70 - 99 mg/dL   BUN 21 (H) 6 - 20 mg/dL   Creatinine, Ser 0.96 0.61 - 1.24 mg/dL   Calcium 9.3 8.9 - 10.3 mg/dL   Total Protein 7.4 6.5 - 8.1 g/dL   Albumin 4.4 3.5 - 5.0 g/dL   AST 30 15 - 41 U/L   ALT 50 (H) 0 - 44 U/L   Alkaline Phosphatase 81 38 - 126 U/L   Total Bilirubin 0.8 0.3 - 1.2 mg/dL   GFR calc non Af Amer >60 >60 mL/min   GFR calc Af Amer >60 >60 mL/min   Anion gap 11 5 - 15  CBC  Result Value Ref Range   WBC 7.4 4.0 - 10.5 K/uL   RBC 5.67 4.22 - 5.81 MIL/uL   Hemoglobin 16.3 13.0 - 17.0 g/dL   HCT 48.3 39.0 - 52.0 %   MCV 85.2 78.0 - 100.0 fL   MCH 28.7 26.0 - 34.0 pg   MCHC 33.7 30.0 - 36.0 g/dL   RDW 13.2 11.5 - 15.5 %   Platelets 163 150 - 400 K/uL  Urinalysis, Routine w reflex microscopic  Result Value Ref Range   Color, Urine YELLOW YELLOW   APPearance CLEAR CLEAR   Specific Gravity, Urine 1.021 1.005 - 1.030   pH 5.0 5.0 - 8.0   Glucose, UA NEGATIVE NEGATIVE mg/dL   Hgb urine dipstick NEGATIVE NEGATIVE   Bilirubin Urine NEGATIVE NEGATIVE   Ketones, ur NEGATIVE NEGATIVE mg/dL   Protein, ur NEGATIVE NEGATIVE mg/dL   Nitrite NEGATIVE NEGATIVE   Leukocytes, UA  NEGATIVE NEGATIVE    EKG None  Radiology Ct Abdomen Pelvis W Contrast  Result Date: 11/15/2017 CLINICAL DATA:  Right lower abdominal pain EXAM: CT ABDOMEN AND PELVIS WITH CONTRAST TECHNIQUE: Multidetector CT imaging of the abdomen and pelvis was performed using the standard protocol following bolus administration of intravenous contrast. CONTRAST:  182mL ISOVUE-300 IOPAMIDOL (ISOVUE-300) INJECTION 61% COMPARISON:  03/24/2016 FINDINGS: Lower chest: Lung  bases are clear. No effusions. Heart is normal size. Hepatobiliary: Fatty infiltration of the liver. No focal abnormality or biliary ductal dilatation. Gallbladder unremarkable. Pancreas: No focal abnormality or ductal dilatation. Spleen: No focal abnormality.  Normal size. Adrenals/Urinary Tract: No adrenal abnormality. No focal renal abnormality. No stones or hydronephrosis. Urinary bladder is unremarkable. Stomach/Bowel: Appendix is visualized and is normal. Stomach, large and small bowel grossly unremarkable. Vascular/Lymphatic: No evidence of aneurysm or adenopathy. Reproductive: No visible focal abnormality. Other: No free fluid or free air. Small umbilical hernia containing fat and fluid. Small bilateral umbilical hernias containing fat. Musculoskeletal: No acute bony abnormality.  Left hip replacement. IMPRESSION: Mild fatty infiltration of the liver. Normal appendix. Small umbilical hernia containing fat and fluid. Small umbilical hernias containing fat. Electronically Signed   By: Rolm Baptise M.D.   On: 11/15/2017 11:20    Procedures Procedures (including critical care time)  Medications Ordered in ED Medications  sodium chloride 0.9 % bolus 1,000 mL (1,000 mLs Intravenous New Bag/Given 11/15/17 0914)  HYDROmorphone (DILAUDID) injection 1 mg (1 mg Intravenous Given 11/15/17 0911)  ondansetron (ZOFRAN) injection 4 mg (4 mg Intravenous Given 11/15/17 0911)     Initial Impression / Assessment and Plan / ED Course  I have reviewed the  triage vital signs and the nursing notes.  Pertinent labs & imaging results that were available during my care of the patient were reviewed by me and considered in my medical decision making (see chart for details).  Initially, unable to reduce hernia - pt unable to tolerate/comply with attempt to reduce w gentle pressure. Will given pain rx.   Labs sent. Dilaudid 1 mg iv. zofran iv. icepack to area.  Reviewed nursing notes and prior charts for additional history.   Umbilical hernia easily reduced once pain medication on board/pt calmer/relaxed.   After hernia reduced, rlq tenderness persists - will get imaging.   Ct reviewed - hernias noted, appendix normal.   Hernias reduced. No pain.  Pt appears stable for d/c.     Final Clinical Impressions(s) / ED Diagnoses   Final diagnoses:  None    ED Discharge Orders    None       Lajean Saver, MD 11/15/17 1132

## 2017-11-15 NOTE — ED Triage Notes (Signed)
Patient c/o right lower abdominal pain and states that he has an umbilical hernia. Abdomen started hurting this AM and feels pain when he coughs.

## 2017-11-15 NOTE — Discharge Instructions (Addendum)
It was our pleasure to provide your ER care today - we hope that you feel better.  Follow up with general surgeon in the next 1-2 weeks - call office to arrange appointment.   Also, your blood pressure is high today - follow up with primary care doctor for recheck in the next 1-2 weeks.   Return to ER if worse, new symptoms, fevers, if hernia comes out and will not go back in and/or becomes very painful and swollen, persistent vomiting, other concern.   You were given pain medication in the ER - no driving for the next 6 hours.

## 2018-08-01 ENCOUNTER — Other Ambulatory Visit: Payer: Self-pay

## 2018-08-01 ENCOUNTER — Encounter: Payer: Self-pay | Admitting: General Surgery

## 2018-08-01 ENCOUNTER — Encounter (INDEPENDENT_AMBULATORY_CARE_PROVIDER_SITE_OTHER): Payer: Self-pay

## 2018-08-01 ENCOUNTER — Ambulatory Visit (INDEPENDENT_AMBULATORY_CARE_PROVIDER_SITE_OTHER): Payer: 59 | Admitting: General Surgery

## 2018-08-01 VITALS — BP 144/99 | HR 90 | Temp 98.2°F | Resp 16 | Ht 73.0 in | Wt 345.0 lb

## 2018-08-01 DIAGNOSIS — K429 Umbilical hernia without obstruction or gangrene: Secondary | ICD-10-CM | POA: Diagnosis not present

## 2018-08-01 NOTE — Progress Notes (Signed)
[facility] History and Physical  Reason for Referral: Umbilical hernia  Referring Physician:  Dr. Quintin Alto   Chief Complaint    Pre-op Exam      Leroy Hebert is a 43 y.o. male.  HPI: Leroy Hebert is a 43 yo who presents with complaints of abdominal pain and findings of an umbilical hernia that he has had for over 20 years but has been getting larger and causing him more pain. He says that on occasion the hernia gets stuck out for a period of time and is harder and ultimately reduces on its own, but is painful for a while.  He says he that he is ready to get this fixed because things seem to be getting worse and more frequent with regards to the pain.  He describes the pain as sharp in nature.  He denies any nausea or vomiting.   Past Medical History:  Diagnosis Date  . Anxiety   . Avascular necrosis of bone of right hip ([place]) 06/15/2016  . Depression   . GERD (gastroesophageal reflux disease)   . Hernia, hiatal   . Hypertension 06/04/2016   pt no longer prescribed medication for HTN    Past Surgical History:  Procedure Laterality Date  . TONSILLECTOMY    . TOTAL HIP ARTHROPLASTY Left 06/15/2016   Procedure: TOTAL HIP ARTHROPLASTY;  Surgeon: Marchia Bond, MD;  Location: [facility];  Service: Orthopedics;  Laterality: Left;    Family History  Problem Relation Age of Onset  . Lung cancer Mother        with mets  . Heart disease Father   . Colon cancer Neg Hx   . Stomach cancer Neg Hx     Social History   Tobacco Use  . Smoking status: Former Smoker    Quit date: 05/04/2016    Years since quitting: 2.2  . Smokeless tobacco: Never Used  Substance Use Topics  . Alcohol use: No  . Drug use: No    Medications: I have reviewed the patient's current medications. He says he is only taking his omeprazole and is not on other medications.  Allergies as of 08/01/2018   No Known Allergies     Medication List       Accurate as of August 01, 2018  1:19 PM. If you  have any questions, ask your nurse or doctor.        meloxicam 15 MG tablet Commonly known as: MOBIC Take 15 mg by mouth daily.   omeprazole 20 MG capsule Commonly known as: PRILOSEC Take 20 mg by mouth daily.   ondansetron 4 MG tablet Commonly known as: Zofran Take 1 tablet (4 mg total) by mouth every 8 (eight) hours as needed for nausea or vomiting.   PARoxetine 10 MG tablet Commonly known as: PAXIL Take 10 mg by mouth daily.   prochlorperazine 25 MG suppository Commonly known as: Compazine Place 1 suppository (25 mg total) rectally every 12 (twelve) hours as needed for nausea or vomiting.        ROS:  A comprehensive review of systems was negative except for: Gastrointestinal: positive for abdominal pain Musculoskeletal: positive for left hip pain  Blood pressure (!) 144/99, pulse 90, temperature 98.2 F (36.8 C), temperature source Temporal, resp. rate 16, height 6\' 1"  (1.854 m), weight (!) 345 lb (156.5 kg), SpO2 92 %. Physical Exam Vitals signs reviewed.  Constitutional:      Appearance: Normal appearance.  HENT:     Head: Normocephalic.  Right Ear: Tympanic membrane normal.     Nose: Nose normal.     Mouth/Throat:     Mouth: Mucous membranes are moist.  Eyes:     Pupils: Pupils are equal, round, and reactive to light.  Neck:     Musculoskeletal: Normal range of motion.  Cardiovascular:     Rate and Rhythm: Normal rate and regular rhythm.  Pulmonary:     Effort: Pulmonary effort is normal. No respiratory distress.     Breath sounds: Normal breath sounds.  Abdominal:     General: There is no distension.     Palpations: Abdomen is soft.     Tenderness: There is no abdominal tenderness.     Hernia: A hernia is present. Hernia is present in the umbilical area.     Comments: Reducible hernia, difficult to appreciate the size   Neurological:     Mental Status: He is alert.     Results: CT a/p 75/6433- umbilical hernia noted on CT, fat containing  hernia, hernia 2.5cm on the CT    Assessment & Plan:  Leroy Hebert is a 43 y.o. male with an umbilical hernia that he has had for years but it is getting larger and causing him pain. He works Architect. We discussed the risk factors for hernia including obesity, smoking, lifting, coughing, and diabetes, and we discussed the risk of recurrence including excess weight, coughing, smoking, lifting, and diabetes/ things that prevent wound healing. We discussed need for restrictions for 6-8 weeks after surgery to aid in healing and prevent recurrence.   -Umbilical hernia with mesh repair in the upcoming weeks  -He will notify us with his dates for surgery that work for his work schedule.  -We have discussed the [place] 19 pandemic. We have discussed [facility]'s limited elective surgery schedule and COVID 19 testing to keep all patient's safe during this pandemic. We have discussed the need for isolation after their testing and requirements of staying out of work, not going to stores or visiting with people.   All questions were answered to the satisfaction of the patient.   Virl Cagey 08/01/2018, 1:19 PM

## 2018-08-01 NOTE — Patient Instructions (Signed)
Umbilical Hernia, Adult  A hernia is a bulge of tissue that pushes through an opening between muscles. An umbilical hernia happens in the abdomen, near the belly button (umbilicus). The hernia may contain tissues from the small intestine, large intestine, or fatty tissue covering the intestines (omentum). Umbilical hernias in adults tend to get worse over time, and they require surgical treatment. There are several types of umbilical hernias. You may have:  A hernia located just above or below the umbilicus (indirect hernia). This is the most common type of umbilical hernia in adults.  A hernia that forms through an opening formed by the umbilicus (direct hernia).  A hernia that comes and goes (reducible hernia). A reducible hernia may be visible only when you strain, lift something heavy, or cough. This type of hernia can be pushed back into the abdomen (reduced).  A hernia that traps abdominal tissue inside the hernia (incarcerated hernia). This type of hernia cannot be reduced.  A hernia that cuts off blood flow to the tissues inside the hernia (strangulated hernia). The tissues can start to die if this happens. This type of hernia requires emergency treatment. What are the causes? An umbilical hernia happens when tissue inside the abdomen presses on a weak area of the abdominal muscles. What increases the risk? You may have a greater risk of this condition if you:  Are obese.  Have had several pregnancies.  Have a buildup of fluid inside your abdomen (ascites).  Have had surgery that weakens the abdominal muscles. What are the signs or symptoms? The main symptom of this condition is a painless bulge at or near the belly button. A reducible hernia may be visible only when you strain, lift something heavy, or cough. Other symptoms may include:  Dull pain.  A feeling of pressure. Symptoms of a strangulated hernia may include:  Pain that gets increasingly worse.  Nausea and  vomiting.  Pain when pressing on the hernia.  Skin over the hernia becoming red or purple.  Constipation.  Blood in the stool. How is this diagnosed? This condition may be diagnosed based on:  A physical exam. You may be asked to cough or strain while standing. These actions increase the pressure inside your abdomen and force the hernia through the opening in your muscles. Your health care provider may try to reduce the hernia by pressing on it.  Your symptoms and medical history. How is this treated? Surgery is the only treatment for an umbilical hernia. Surgery for a strangulated hernia is done as soon as possible. If you have a small hernia that is not incarcerated, you may need to lose weight before having surgery. Follow these instructions at home:  Lose weight, if told by your health care provider.  Do not try to push the hernia back in.  Watch your hernia for any changes in color or size. Tell your health care provider if any changes occur.  You may need to avoid activities that increase pressure on your hernia.  Do not lift anything that is heavier than 10 lb (4.5 kg) until your health care provider says that this is safe.  Take over-the-counter and prescription medicines only as told by your health care provider.  Keep all follow-up visits as told by your health care provider. This is important. Contact a health care provider if:  Your hernia gets larger.  Your hernia becomes painful. Get help right away if:  You develop sudden, severe pain near the area of your hernia.    You have pain as well as nausea or vomiting.  You have pain and the skin over your hernia changes color.  You develop a fever. This information is not intended to replace advice given to you by your health care provider. Make sure you discuss any questions you have with your health care provider. Document Released: 07/04/2015 Document Revised: 03/16/2017 Document Reviewed: 08/02/2016 Elsevier  Interactive Patient Education  2019 [employer], Adult  Open hernia repair is a surgical procedure to fix a hernia. A hernia occurs when an internal organ or tissue pushes out through a weak spot in the abdominal wall muscles. Hernias commonly occur in the groin and around the navel. Most hernias tend to get worse over time. Often, surgery is done to prevent the hernia from becoming bigger, uncomfortable, or an emergency. Emergency surgery may be needed if abdominal contents get stuck in the opening (incarcerated hernia) or the blood supply gets cut off (strangulated hernia). In an open repair, an incision is made in the abdomen to perform the surgery. Tell a health care provider about:  Any allergies you have.  All medicines you are taking, including vitamins, herbs, eye drops, creams, and over-the-counter medicines.  Any problems you or family members have had with anesthetic medicines.  Any blood or bone disorders you have.  Any surgeries you have had.  Any medical conditions you have, including any recent cold or flu symptoms.  Whether you are pregnant or may be pregnant. What are the risks? Generally, this is a safe procedure. However, problems may occur, including:  Long-lasting (chronic) pain.  Bleeding.  Infection.  Damage to the testicle. This can cause shrinking or swelling.  Damage to the bladder, blood vessels, intestine, or nerves near the hernia.  Trouble passing urine.  Allergic reactions to medicines.  Return of the hernia. Medicines  Ask your health care provider about: ? Changing or stopping your regular medicines. This is especially important if you are taking diabetes medicines or blood thinners. ? Taking medicines such as aspirin and ibuprofen. These medicines can thin your blood. Do not take these medicines before your procedure if your health care provider instructs you not to.  You may be given antibiotic medicine to help  prevent infection. General instructions  You may have blood tests or imaging studies.  Ask your health care provider how your surgical site will be marked or identified.  If you smoke, do not smoke for at least 2 weeks before your procedure or for as long as told by your health care provider.  Let your health care provider know if you develop a cold or any infection before your surgery.  Plan to have someone take you home from the hospital or clinic.  If you will be going home right after the procedure, plan to have someone with you for 24 hours. What happens during the procedure?  To reduce your risk of infection: ? Your health care team will wash or sanitize their hands. ? Your skin will be washed with soap. ? Hair may be removed from the surgical area.  An IV tube will be inserted into one of your veins.  You will be given one or more of the following: ? A medicine to help you relax (sedative). ? A medicine to numb the area (local anesthetic). ? A medicine to make you fall asleep (general anesthetic).  Your surgeon will make an incision over the hernia.  The tissues of the hernia will be  moved back into place.  The edges of the hernia may be stitched together.  The opening in the abdominal muscles will be closed with stitches (sutures). Or, your surgeon will place a mesh patch made of manmade (synthetic) material over the opening.  The incision will be closed.  A bandage (dressing) may be placed over the incision. The procedure may vary among health care providers and hospitals. What happens after the procedure?  Your blood pressure, heart rate, breathing rate, and blood oxygen level will be monitored until the medicines you were given have worn off.  You may be given medicine for pain.  Do not drive for 24 hours if you received a sedative. This information is not intended to replace advice given to you by your health care provider. Make sure you discuss any  questions you have with your health care provider. Document Released: 07/28/2000 Document Revised: 08/22/2015 Document Reviewed: 07/16/2015 Elsevier Interactive Patient Education  2019 [employer].

## 2020-02-24 IMAGING — CT CT ABD-PELV W/ CM
2 of 5 series · 17 of 46 positions shown, 19 images · IV contrast (ISOVUE)
Comparison: 03/24/2016

CLINICAL DATA: Right lower abdominal pain

EXAM:
CT ABDOMEN AND PELVIS WITH CONTRAST
TECHNIQUE: Multidetector CT imaging of the abdomen and pelvis was performed
using the standard protocol following bolus administration of
intravenous contrast.
CONTRAST:  100mL WVADFV-5UU IOPAMIDOL (WVADFV-5UU) INJECTION 61%

[Series 2: axial st · axial · 0.88mm/px · z∈[-506,-76]mm · 14 of 100 slices shown, 16 images]
[im 7/100  soft-tissue]
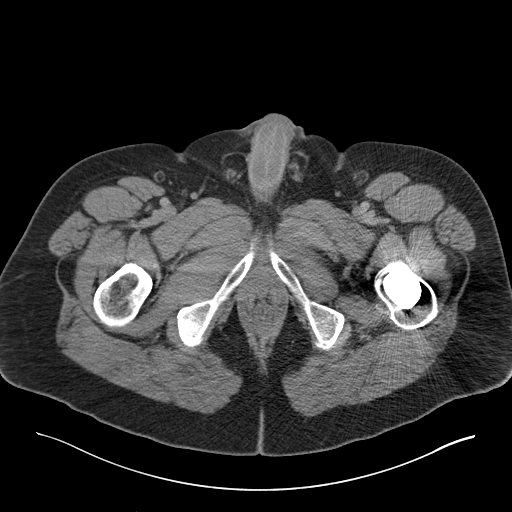
[im 7/100  bone]
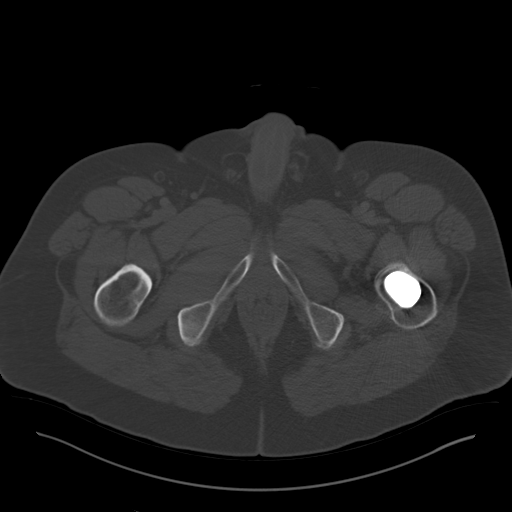
[im 13/100  soft-tissue]
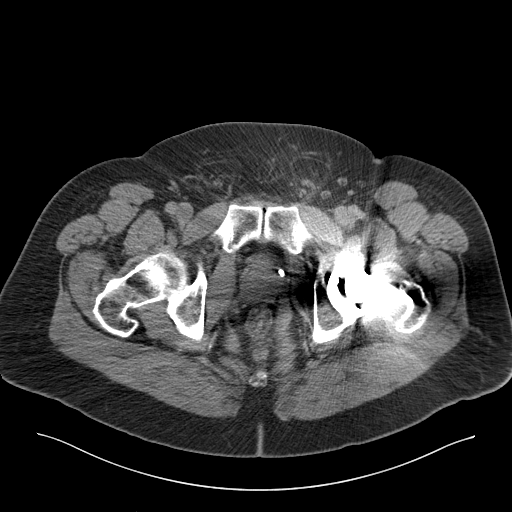
[im 19/100  soft-tissue]
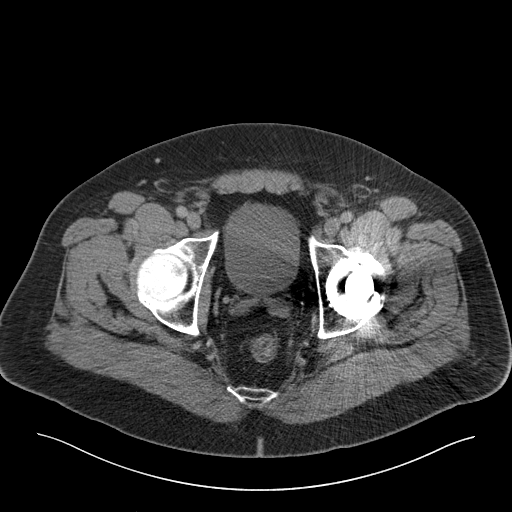
[im 25/100  soft-tissue]
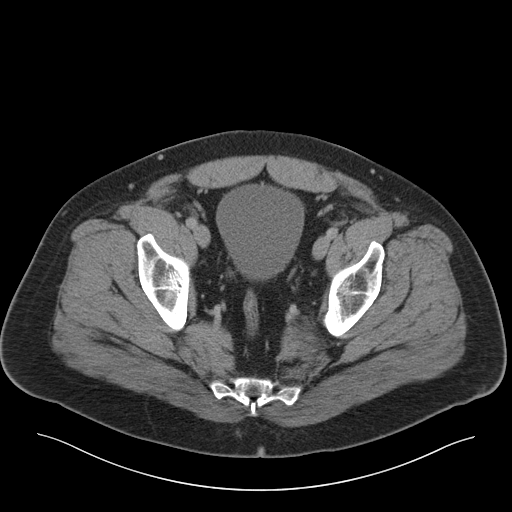
[im 31/100  soft-tissue]
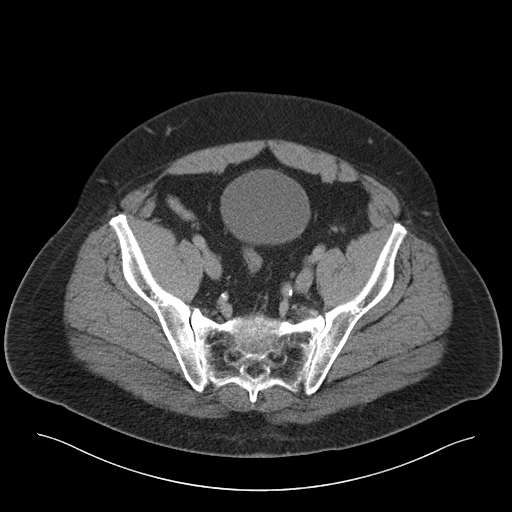
[im 38/100  soft-tissue]
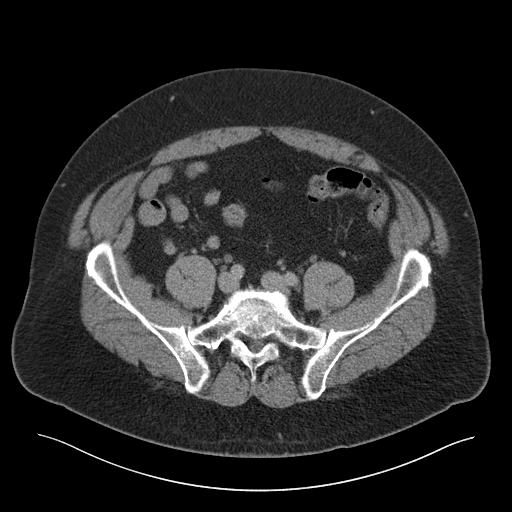
[im 44/100  soft-tissue]
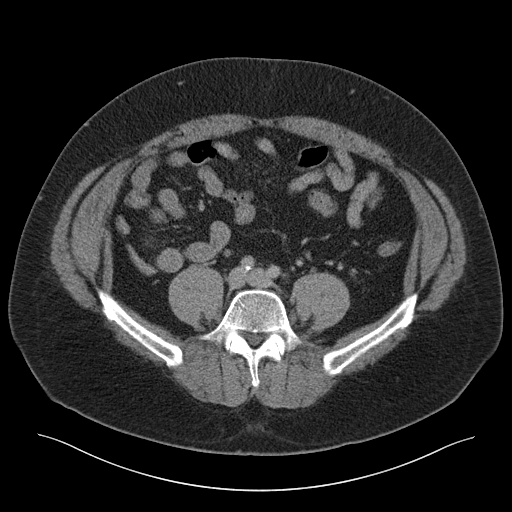
[im 56/100  soft-tissue]
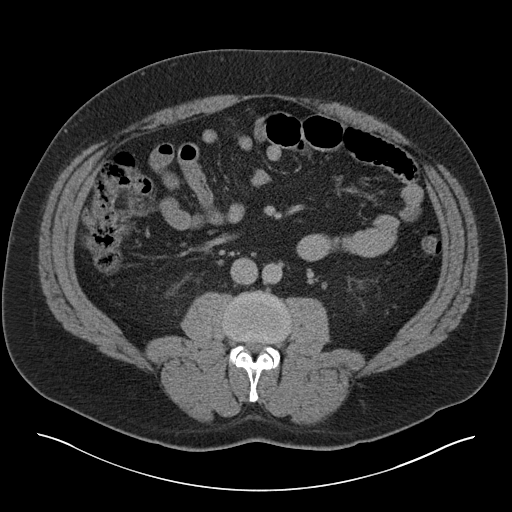
[im 62/100  soft-tissue]
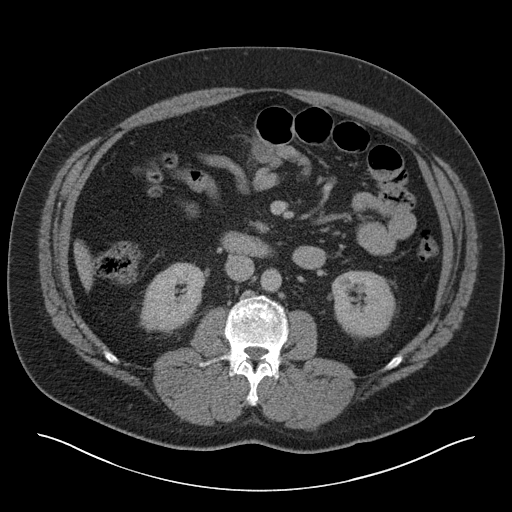
[im 62/100  bone]
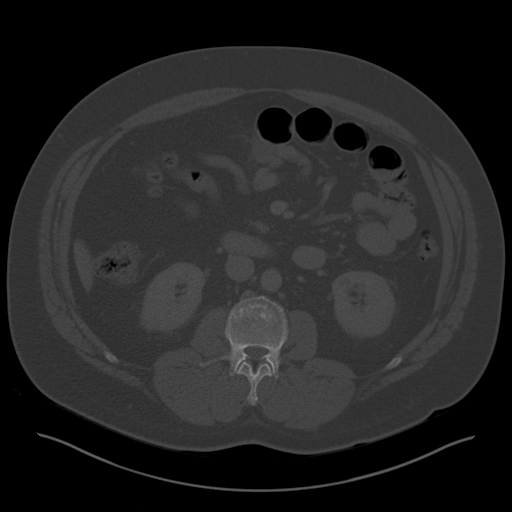
[im 69/100  soft-tissue]
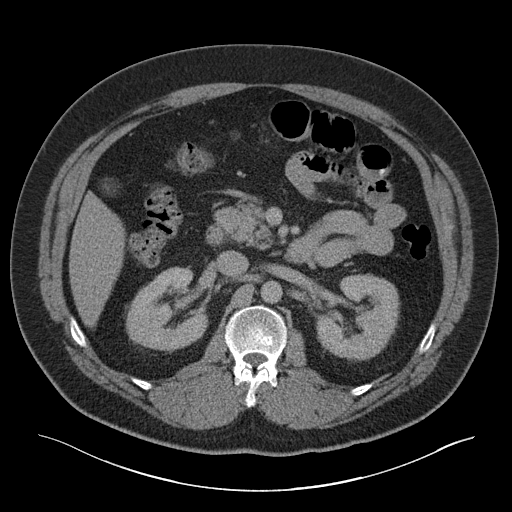
[im 75/100  soft-tissue]
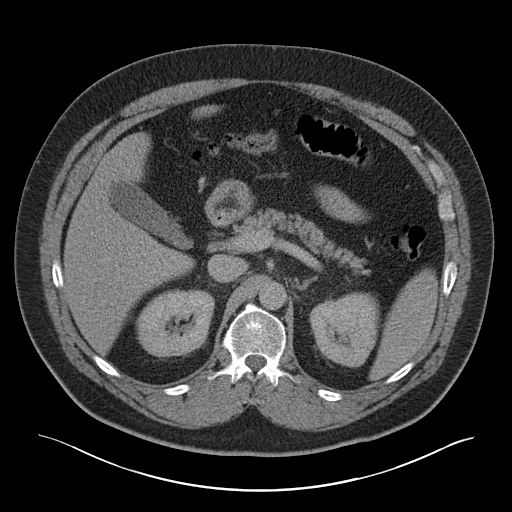
[im 81/100  soft-tissue]
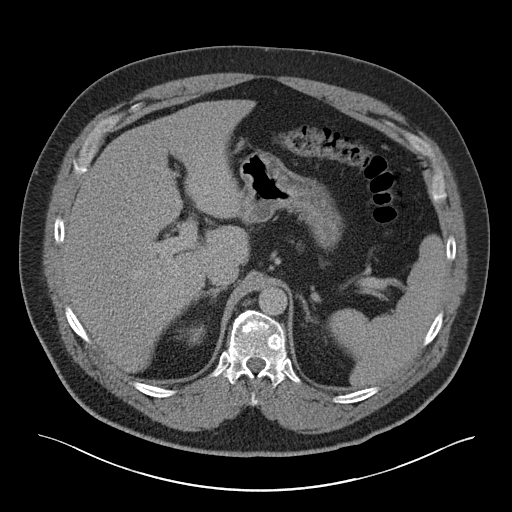
[im 87/100  soft-tissue]
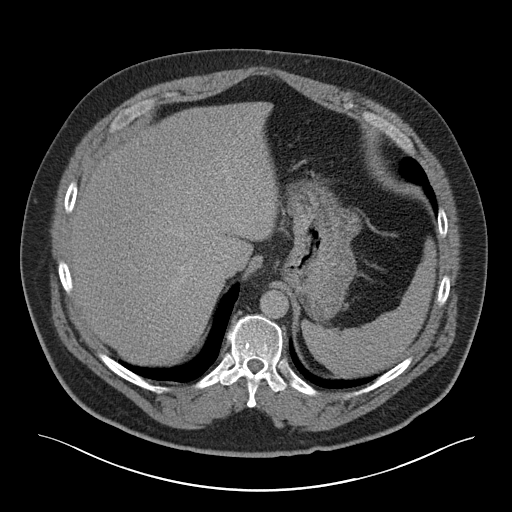
[im 93/100  soft-tissue]
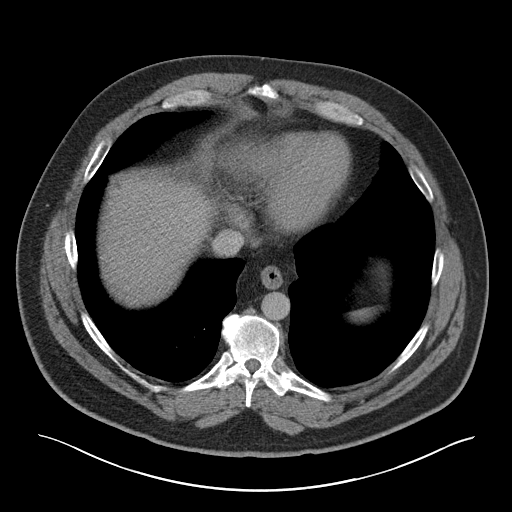

[Series 4: coronal st · coronal · 1.02mm/px · 3 of 102 slices shown]
[im 34/102  soft-tissue]
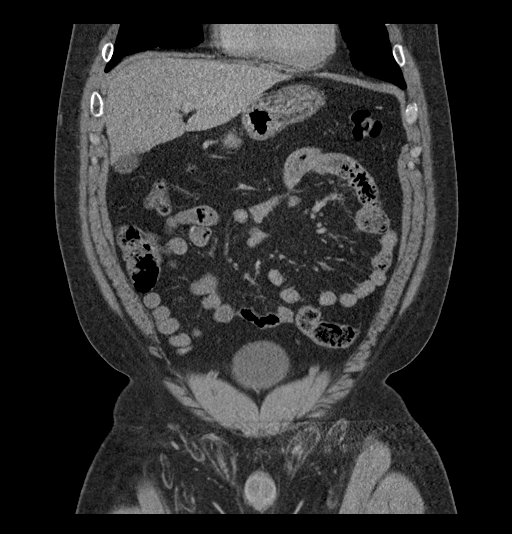
[im 45/102  soft-tissue]
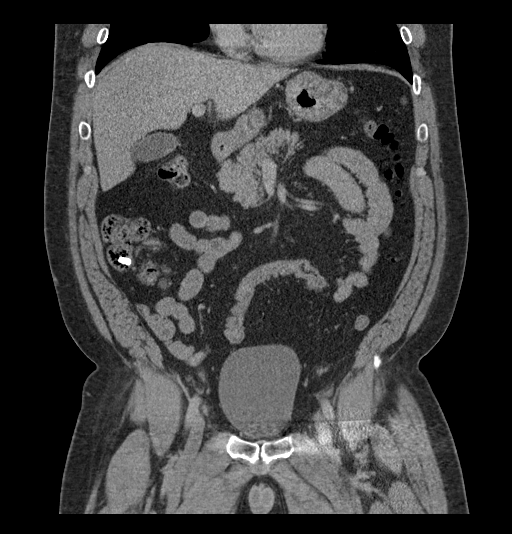
[im 57/102  soft-tissue]
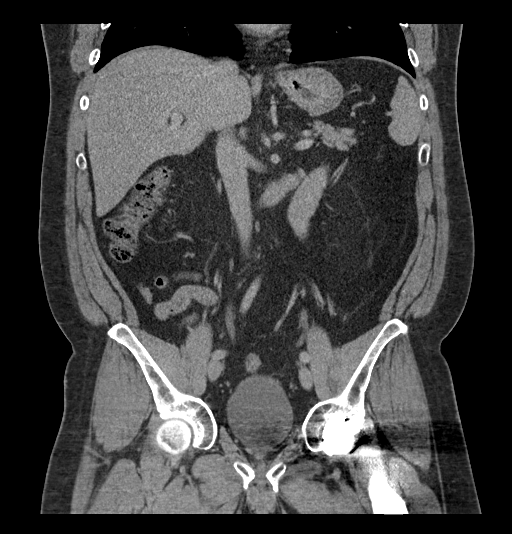

[17 of 46 positions shown; findings below may reference images not displayed]

FINDINGS: Lower chest: Lung bases are clear. No effusions. Heart is normal
size.

Hepatobiliary: Fatty infiltration of the liver. No focal abnormality
or biliary ductal dilatation. Gallbladder unremarkable.

Pancreas: No focal abnormality or ductal dilatation.

Spleen: No focal abnormality.  Normal size.

Adrenals/Urinary Tract: No adrenal abnormality. No focal renal
abnormality. No stones or hydronephrosis. Urinary bladder is
unremarkable.

Stomach/Bowel: Appendix is visualized and is normal. Stomach, large
and small bowel grossly unremarkable.

Vascular/Lymphatic: No evidence of aneurysm or adenopathy.

Reproductive: No visible focal abnormality.

Other: No free fluid or free air. Small umbilical hernia containing
fat and fluid. Small bilateral umbilical hernias containing fat.

Musculoskeletal: No acute bony abnormality.  Left hip replacement.
IMPRESSION: Mild fatty infiltration of the liver.

Normal appendix.

Small umbilical hernia containing fat and fluid. Small umbilical
hernias containing fat.

## 2020-08-06 ENCOUNTER — Encounter: Payer: Self-pay | Admitting: Internal Medicine

## 2020-10-02 ENCOUNTER — Other Ambulatory Visit (HOSPITAL_COMMUNITY)
Admission: RE | Admit: 2020-10-02 | Discharge: 2020-10-02 | Disposition: A | Discharge status: Home / Self Care | Payer: 59 | Source: Ambulatory Visit | Attending: Internal Medicine | Admitting: Internal Medicine

## 2020-10-02 ENCOUNTER — Ambulatory Visit: Payer: 59 | Admitting: Internal Medicine

## 2020-10-02 ENCOUNTER — Encounter: Payer: Self-pay | Admitting: Internal Medicine

## 2020-10-02 ENCOUNTER — Other Ambulatory Visit: Payer: Self-pay

## 2020-10-02 VITALS — BP 141/87 | HR 67 | Temp 97.8°F | Ht 73.0 in | Wt 338.2 lb

## 2020-10-02 DIAGNOSIS — R7989 Other specified abnormal findings of blood chemistry: Secondary | ICD-10-CM

## 2020-10-02 DIAGNOSIS — K760 Fatty (change of) liver, not elsewhere classified: Secondary | ICD-10-CM

## 2020-10-02 DIAGNOSIS — R768 Other specified abnormal immunological findings in serum: Secondary | ICD-10-CM | POA: Insufficient documentation

## 2020-10-02 DIAGNOSIS — Z1211 Encounter for screening for malignant neoplasm of colon: Secondary | ICD-10-CM

## 2020-10-02 DIAGNOSIS — R945 Abnormal results of liver function studies: Secondary | ICD-10-CM

## 2020-10-02 LAB — CBC
HCT: 46 % (ref 39.0–52.0)
Hemoglobin: 15.1 g/dL (ref 13.0–17.0)
MCH: 28.9 pg (ref 26.0–34.0)
MCHC: 32.8 g/dL (ref 30.0–36.0)
MCV: 88.1 fL (ref 80.0–100.0)
Platelets: 86 10*3/uL — ABNORMAL LOW (ref 150–400)
RBC: 5.22 MIL/uL (ref 4.22–5.81)
RDW: 12.9 % (ref 11.5–15.5)
WBC: 4.7 10*3/uL (ref 4.0–10.5)
nRBC: 0 % (ref 0.0–0.2)

## 2020-10-02 LAB — HIV ANTIBODY (ROUTINE TESTING W REFLEX): HIV Screen 4th Generation wRfx: NONREACTIVE

## 2020-10-02 LAB — COMPREHENSIVE METABOLIC PANEL
ALT: 323 U/L — ABNORMAL HIGH (ref 0–44)
AST: 182 U/L — ABNORMAL HIGH (ref 15–41)
Albumin: 4 g/dL (ref 3.5–5.0)
Alkaline Phosphatase: 76 U/L (ref 38–126)
Anion gap: 9 (ref 5–15)
BUN: 18 mg/dL (ref 6–20)
CO2: 26 mmol/L (ref 22–32)
Calcium: 8.7 mg/dL — ABNORMAL LOW (ref 8.9–10.3)
Chloride: 103 mmol/L (ref 98–111)
Creatinine, Ser: 0.71 mg/dL (ref 0.61–1.24)
GFR, Estimated: >60 mL/min (ref >60)
Glucose, Bld: 113 mg/dL — ABNORMAL HIGH (ref 70–99)
Potassium: 4.2 mmol/L (ref 3.5–5.1)
Sodium: 138 mmol/L (ref 135–145)
Total Bilirubin: 1.1 mg/dL (ref 0.3–1.2)
Total Protein: 6.9 g/dL (ref 6.5–8.1)

## 2020-10-02 MED ORDER — CLENPIQ 10-3.5-12 MG-GM -GM/160ML PO SOLN: 1.0000 | Freq: Once | ORAL | 0 refills | Status: AC

## 2020-10-02 NOTE — Progress Notes (Signed)
Primary Care Physician:  Estanislado Pandy, MD Primary Gastroenterologist:  Dr. Marletta Lor  Chief Complaint  Patient presents with   elevated lft   Hepatitis C    Newly diagnosed per pt. Has not started treatment   Gastroesophageal Reflux    burning in chest/throat. Takes omep OTC daily and it helps as long as he takes it everyday    HPI:   Leroy Hebert is a 45 y.o. male who presents to clinic today by referral from [facility] for hepatitis C.  Patient recently had blood work done which showed abnormal LFTs, AST 255, ALT 459, alk phos 101, T bili 0.8.  Subsequent viral hepatitis panel showed hepatitis C antibody positive, hep B surface antigen negative, hep B core antibody negative, hep a antibody negative.  Patient was somewhat surprised about this diagnosis.  He denies any history of IV drug use.  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  Does not know any contacts with hepatitis C.  No blood transfusions.  Underwent ultrasound which showed fatty liver disease, otherwise unremarkable.  Patient is also due for screening colonoscopy.  Denies ever having any colon cancer screening test before.  No family history of colorectal malignancy.  No abdominal pain.  No unintentional weight loss.  Patient also notes chronic reflux which is well controlled on omeprazole 20 mg.  States he takes this usually every 1 to 2 days.  No dysphagia or odynophagia.  No chest or epigastric pain.  No melena hematochezia.  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     omeprazole (PRILOSEC) 20 MG capsule Take 20 mg by mouth daily.     No current facility-administered medications for this visit.    Allergies as of 10/02/2020   (No Known Allergies)    Family History  Problem Relation Age of Onset   Lung cancer Mother        with mets   Heart disease Father    Colon cancer Neg Hx    Stomach cancer Neg Hx     Social History   Socioeconomic History   Marital status: Divorced    Spouse name: Not on file   Number of children: 2   Years of education: Not on file   Highest education level: Not on file  Occupational History   Not on file  Tobacco Use   Smoking status: Former    Types: Cigarettes    Quit date: 05/04/2016    Years since quitting: 4.4   Smokeless tobacco: Never  Vaping Use   Vaping Use: Never used  Substance and Sexual Activity   Alcohol use: Not Currently    Comment: none in 5 years but prior was a drinker   Drug use: Not Currently    Types: Marijuana    Comment: none in 9 yeats   Sexual  activity: Yes    Partners: Female  Other Topics Concern   Not on file  Social History Narrative   Single       2 girls       Corporate investment banker    Social Determinants of Health   Financial Resource Strain: Not on file  Food Insecurity: Not on file  Transportation Needs: Not on file  Physical Activity: Not on file  Stress: Not on file  Social Connections: Not on file  Intimate Partner Violence: Not on file    Subjective: Review of Systems  Constitutional:  Negative for chills and fever.  HENT:  Negative for congestion and hearing loss.   Eyes:  Negative for blurred vision and double vision.  Respiratory:  Negative for cough and shortness of breath.   Cardiovascular:  Negative for chest pain and palpitations.  Gastrointestinal:  Positive for  heartburn. Negative for abdominal pain, blood in stool, constipation, diarrhea, melena and vomiting.  Genitourinary:  Negative for dysuria and urgency.  Musculoskeletal:  Negative for joint pain and myalgias.  Skin:  Negative for itching and rash.  Neurological:  Negative for dizziness and headaches.  Psychiatric/Behavioral:  Negative for depression. The patient is not nervous/anxious.       Objective: BP (!) 141/87   Pulse 67   Temp 97.8 F (36.6 C)   Ht 6\' 1"  (1.854 m)   Wt (!) 338 lb 3.2 oz (153.4 kg)   BMI 44.62 kg/m  Physical Exam Constitutional:      Appearance: Normal appearance. He is obese.  HENT:     Head: Normocephalic and atraumatic.  Eyes:     Extraocular Movements: Extraocular movements intact.     Conjunctiva/sclera: Conjunctivae normal.  Cardiovascular:     Rate and Rhythm: Normal rate and regular rhythm.  Pulmonary:     Effort: Pulmonary effort is normal.     Breath sounds: Normal breath sounds.  Abdominal:     General: Bowel sounds are normal.     Palpations: Abdomen is soft.  Musculoskeletal:        General: Normal range of motion.     Cervical back: Normal range of motion and neck supple.  Skin:    General: Skin is warm.  Neurological:     General: No focal deficit present.     Mental Status: He is alert and oriented to person, place, and time.  Psychiatric:        Mood and Affect: Mood normal.        Behavior: Behavior normal.     Assessment: *Hepatitis C antibody positive *Fatty liver disease *Abnormal liver function test *Colon cancer screening *Acid reflux-well-controlled on omeprazole 20 mg  Plan: Discussed hepatitis C in depth with patient today including treatment.  We will check viral load and genotype.  We will also screen for HIV, patient is agreeable to this.  Recommended patient's wife and immediate family members in the house will be screened for hepatitis C.  Also counseled on potential transmission.  Abnormal liver  function test likely due to chronic hepatitis C though also has fatty liver on ultrasound.  Continue to monitor this with repeat LFTs today.  We will hold off on full serological work-up for now until hepatitis C treated.  Will schedule for screening colonoscopy.The risks including infection, bleed, or perforation as well as benefits, limitations, alternatives and imponderables have been reviewed with the patient. Questions have been answered. All parties agreeable.  Patient's acid reflux well-controlled on omeprazole 20 mg.  We will continue.  No alarm symptoms today to warrant further investigation with EGD.  Further recommendations to follow.  10/02/2020 9:03 AM   Disclaimer: This note was dictated with voice recognition software. Similar sounding words can inadvertently be transcribed and may not be corrected upon review.

## 2020-10-02 NOTE — H&P (View-Only) (Signed)
Primary Care Physician:  Estanislado Pandy, MD Primary Gastroenterologist:  Dr. Marletta Lor  Chief Complaint  Patient presents with   elevated lft   Hepatitis C    Newly diagnosed per pt. Has not started treatment   Gastroesophageal Reflux    burning in chest/throat. Takes omep OTC daily and it helps as long as he takes it everyday    HPI:   Leroy Hebert is a 45 y.o. male who presents to clinic today by referral from [facility] for [facility].  Patient recently had blood work done which showed abnormal LFTs, AST 255, ALT 459, alk phos 101, T bili 0.8.  Subsequent viral hepatitis panel showed hepatitis C antibody positive, hep B surface antigen negative, hep B core antibody negative, hep a antibody negative.  Patient was somewhat surprised about this diagnosis.  He denies any history of IV drug use.  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  Does not know any contacts with hepatitis C.  No blood transfusions.  Underwent ultrasound which showed fatty liver disease, otherwise unremarkable.  Patient is also due for screening colonoscopy.  Denies ever having any colon cancer screening test before.  No family history of colorectal malignancy.  No abdominal pain.  No unintentional weight loss.  Patient also notes chronic reflux which is well controlled on omeprazole 20 mg.  States he takes this usually every 1 to 2 days.  No dysphagia or odynophagia.  No chest or epigastric pain.  No melena hematochezia.  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     omeprazole (PRILOSEC) 20 MG capsule Take 20 mg by mouth daily.     No current facility-administered medications for this visit.    Allergies as of 10/02/2020   (No Known Allergies)    Family History  Problem Relation Age of Onset   Lung cancer Mother        with mets   Heart disease Father    Colon cancer Neg Hx    Stomach cancer Neg Hx     Social History   Socioeconomic History   Marital status: Divorced    Spouse name: Not on file   Number of children: 2   Years of education: Not on file   Highest education level: Not on file  Occupational History   Not on file  Tobacco Use   Smoking status: Former    Types: Cigarettes    Quit date: 05/04/2016    Years since quitting: 4.4   Smokeless tobacco: Never  Vaping Use   Vaping Use: Never used  Substance and Sexual Activity   Alcohol use: Not Currently    Comment: none in 5 years but prior was a drinker   Drug use: Not Currently    Types: Marijuana    Comment: none in 9 yeats   Sexual  activity: Yes    Partners: Female  Other Topics Concern   Not on file  Social History Narrative   Single       2 girls       Nature conservation officer    Social Determinants of Health   Financial Resource Strain: Not on file  Food Insecurity: Not on file  Transportation Needs: Not on file  Physical Activity: Not on file  Stress: Not on file  Social Connections: Not on file  Intimate Partner Violence: Not on file    Subjective: Review of Systems  Constitutional:  Negative for chills and fever.  HENT:  Negative for congestion and hearing loss.   Eyes:  Negative for blurred vision and double vision.  Respiratory:  Negative for cough and shortness of breath.   Cardiovascular:  Negative for chest pain and palpitations.  Gastrointestinal:  Positive for  heartburn. Negative for abdominal pain, blood in stool, constipation, diarrhea, melena and vomiting.  Genitourinary:  Negative for dysuria and urgency.  Musculoskeletal:  Negative for joint pain and myalgias.  Skin:  Negative for itching and rash.  Neurological:  Negative for dizziness and headaches.  Psychiatric/Behavioral:  Negative for depression. The patient is not nervous/anxious.       Objective: BP (!) 141/87   Pulse 67   Temp 97.8 F (36.6 C)   Ht $R'6\' 1"'WN$  (1.854 m)   Wt (!) 338 lb 3.2 oz (153.4 kg)   BMI 44.62 kg/m  Physical Exam Constitutional:      Appearance: Normal appearance. He is obese.  HENT:     Head: Normocephalic and atraumatic.  Eyes:     Extraocular Movements: Extraocular movements intact.     Conjunctiva/sclera: Conjunctivae normal.  Cardiovascular:     Rate and Rhythm: Normal rate and regular rhythm.  Pulmonary:     Effort: Pulmonary effort is normal.     Breath sounds: Normal breath sounds.  Abdominal:     General: Bowel sounds are normal.     Palpations: Abdomen is soft.  Musculoskeletal:        General: Normal range of motion.     Cervical back: Normal range of motion and neck supple.  Skin:    General: Skin is warm.  Neurological:     General: No focal deficit present.     Mental Status: He is alert and oriented to person, place, and time.  Psychiatric:        Mood and Affect: Mood normal.        Behavior: Behavior normal.     Assessment: *Hepatitis C antibody positive *Fatty liver disease *Abnormal liver function test *Colon cancer screening *Acid reflux-well-controlled on omeprazole 20 mg  Plan: Discussed hepatitis C in depth with patient today including treatment.  We will check viral load and genotype.  We will also screen for HIV, patient is agreeable to this.  Recommended patient's wife and immediate family members in the house will be screened for hepatitis C.  Also counseled on potential transmission.  Abnormal liver  function test likely due to chronic hepatitis C though also has fatty liver on ultrasound.  Continue to monitor this with repeat LFTs today.  We will hold off on full serological work-up for now until hepatitis C treated.  Will schedule for screening colonoscopy.The risks including infection, bleed, or perforation as well as benefits, limitations, alternatives and imponderables have been reviewed with the patient. Questions have been answered. All parties agreeable.  Patient's acid reflux well-controlled on omeprazole 20 mg.  We will continue.  No alarm symptoms today to warrant further investigation with EGD.  Further recommendations to follow.  10/02/2020 9:03 AM   Disclaimer: This note was dictated with voice recognition software. Similar sounding words can inadvertently be transcribed and may not be corrected upon review.

## 2020-10-02 NOTE — Patient Instructions (Signed)
For your chronic hepatitis C, I will check viral load and genotype today.  We will also screen for HIV.  You have already completed your ultrasound which is great.  We will start the process of obtaining medication for treatment.  This will either be an 8 to 12-week course depending on medication used as well as genotype.  We will need to check viral load halfway through treatment in 3 months after completion.  I would recommend all your immediate family members in your household be screened for hepatitis C.  Be careful not to share anything that could potentially expose them to shared blood including razors, toothbrushes, etc.  We will schedule you for colonoscopy for colon cancer screening today.  It was nice meeting you today.  Dr. Abbey Chatters  At [facility] we value your feedback. You may receive a survey about your visit today. Please share your experience as we strive to create trusting relationships with our patients to provide genuine, compassionate, quality care.  We appreciate your understanding and patience as we review any laboratory studies, imaging, and other diagnostic tests that are ordered as we care for you. Our office policy is 5 business days for review of these results, and any emergent or urgent results are addressed in a timely manner for your best interest. If you do not hear from our office in 1 week, please contact us.   We also encourage the use of MyChart, which contains your medical information for your review as well. If you are not enrolled in this feature, an access code is on this after visit summary for your convenience. Thank you for allowing [place] to be involved in your care.  It was great to see you today!  I hope you have a great rest of your summer!!    Leroy Hebert. Abbey Chatters, D.O. Gastroenterology and Hepatology [facility]

## 2020-10-03 LAB — HCV RNA QUANT
HCV Quantitative Log: 6.921 log10 IU/mL (ref >1.70)
HCV Quantitative: 8340000 IU/mL (ref >50)

## 2020-10-09 LAB — HEPATITIS C GENOTYPE

## 2020-10-27 NOTE — Patient Instructions (Signed)
Leroy Hebert  10/27/2020     '@PREFPERIOPPHARMACY'$ @   Your procedure is scheduled on  10/31/2020.   Report to [facility] at  0700 A.M.   Call this number if you have problems the morning of surgery:  [phone]   Remember:  Follow the diet and prep instructions given to you by the office.    Take these medicines the morning of surgery with A SIP OF WATER                                           prilosec.     Do not wear jewelry, make-up or nail polish.  Do not wear lotions, powders, or perfumes, or deodorant.  Do not shave 48 hours prior to surgery.  Men may shave face and neck.  Do not bring valuables to the hospital.  [facility] is not responsible for any belongings or valuables.  Contacts, dentures or bridgework may not be worn into surgery.  Leave your suitcase in the car.  After surgery it may be brought to your room.  For patients admitted to the hospital, discharge time will be determined by your treatment team.  Patients discharged the day of surgery will not be allowed to drive home and must have someone with them for 24 hours.    Special instructions:   DO NOT smoke tobacco or vape for 24 hours before your procedure.  Please read over the following fact sheets that you were given. Anesthesia Post-op Instructions and Care and Recovery After Surgery      Colonoscopy, Adult, Care After This sheet gives you information about how to care for yourself after your procedure. Your health care provider may also give you more specific instructions. If you have problems or questions, contact your health care provider. What can I expect after the procedure? After the procedure, it is common to have: A small amount of blood in your stool for 24 hours after the procedure. Some gas. Mild cramping or bloating of your abdomen. Follow these instructions at home: Eating and drinking  Drink enough fluid to keep your urine pale yellow. Follow instructions from  your health care provider about eating or drinking restrictions. Resume your normal diet as instructed by your health care provider. Avoid heavy or fried foods that are hard to digest. Activity Rest as told by your health care provider. Avoid sitting for a long time without moving. Get up to take short walks every 1-2 hours. This is important to improve blood flow and breathing. Ask for help if you feel weak or unsteady. Return to your normal activities as told by your health care provider. Ask your health care provider what activities are safe for you. Managing cramping and bloating  Try walking around when you have cramps or feel bloated. Apply heat to your abdomen as told by your health care provider. Use the heat source that your health care provider recommends, such as a moist heat pack or a heating pad. Place a towel between your skin and the heat source. Leave the heat on for 20-30 minutes. Remove the heat if your skin turns bright red. This is especially important if you are unable to feel pain, heat, or cold. You may have a greater risk of getting burned. General instructions If you were given a sedative during the procedure, it can affect you  for several hours. Do not drive or operate machinery until your health care provider says that it is safe. For the first 24 hours after the procedure: Do not sign important documents. Do not drink alcohol. Do your regular daily activities at a slower pace than normal. Eat soft foods that are easy to digest. Take over-the-counter and prescription medicines only as told by your health care provider. Keep all follow-up visits as told by your health care provider. This is important. Contact a health care provider if: You have blood in your stool 2-3 days after the procedure. Get help right away if you have: More than a small spotting of blood in your stool. Large blood clots in your stool. Swelling of your abdomen. Nausea or vomiting. A  fever. Increasing pain in your abdomen that is not relieved with medicine. Summary After the procedure, it is common to have a small amount of blood in your stool. You may also have mild cramping and bloating of your abdomen. If you were given a sedative during the procedure, it can affect you for several hours. Do not drive or operate machinery until your health care provider says that it is safe. Get help right away if you have a lot of blood in your stool, nausea or vomiting, a fever, or increased pain in your abdomen. This information is not intended to replace advice given to you by your health care provider. Make sure you discuss any questions you have with your health care provider. Document Revised: 01/26/2019 Document Reviewed: 08/28/2018 Elsevier Patient Education  [place].

## 2020-10-29 ENCOUNTER — Other Ambulatory Visit: Payer: Self-pay

## 2020-10-29 ENCOUNTER — Encounter (HOSPITAL_COMMUNITY)
Admission: RE | Admit: 2020-10-29 | Discharge: 2020-10-29 | Disposition: A | Discharge status: Home / Self Care | Payer: 59 | Source: Ambulatory Visit | Attending: Internal Medicine | Admitting: Internal Medicine

## 2020-10-29 DIAGNOSIS — Z01810 Encounter for preprocedural cardiovascular examination: Secondary | ICD-10-CM | POA: Insufficient documentation

## 2020-10-29 NOTE — Progress Notes (Signed)
   10/29/20 1500  OBSTRUCTIVE SLEEP APNEA  Have you ever been diagnosed with sleep apnea through a sleep study? No  Do you snore loudly (loud enough to be heard through closed doors)?  0  Do you often feel tired, fatigued, or sleepy during the daytime (such as falling asleep during driving or talking to someone)? 0  Has anyone observed you stop breathing during your sleep? 0  Do you have, or are you being treated for high blood pressure? 1  BMI more than 35 kg/m2? 1  Age > 50 (1-yes) 0  Neck circumference greater than:Male 16 inches or larger, Male 17inches or larger? 1  Male Gender (Yes=1) 1  Obstructive Sleep Apnea Score 4

## 2020-10-31 ENCOUNTER — Ambulatory Visit (HOSPITAL_COMMUNITY): Payer: 59 | Admitting: Anesthesiology

## 2020-10-31 ENCOUNTER — Telehealth: Payer: Self-pay | Admitting: Internal Medicine

## 2020-10-31 ENCOUNTER — Encounter (HOSPITAL_COMMUNITY)
Admission: RE | Disposition: A | Discharge status: Home / Self Care | Payer: Self-pay | Source: Home / Self Care | Attending: Internal Medicine

## 2020-10-31 ENCOUNTER — Ambulatory Visit (HOSPITAL_COMMUNITY)
Admission: RE | Admit: 2020-10-31 | Discharge: 2020-10-31 | Disposition: A | Discharge status: Home / Self Care | Payer: 59 | Attending: Internal Medicine | Admitting: Internal Medicine

## 2020-10-31 ENCOUNTER — Other Ambulatory Visit: Payer: Self-pay

## 2020-10-31 ENCOUNTER — Encounter (HOSPITAL_COMMUNITY): Payer: Self-pay

## 2020-10-31 DIAGNOSIS — D123 Benign neoplasm of transverse colon: Secondary | ICD-10-CM | POA: Diagnosis not present

## 2020-10-31 DIAGNOSIS — B1920 Unspecified viral hepatitis C without hepatic coma: Secondary | ICD-10-CM | POA: Diagnosis not present

## 2020-10-31 DIAGNOSIS — K760 Fatty (change of) liver, not elsewhere classified: Secondary | ICD-10-CM | POA: Insufficient documentation

## 2020-10-31 DIAGNOSIS — K219 Gastro-esophageal reflux disease without esophagitis: Secondary | ICD-10-CM | POA: Insufficient documentation

## 2020-10-31 DIAGNOSIS — Z87891 Personal history of nicotine dependence: Secondary | ICD-10-CM | POA: Diagnosis not present

## 2020-10-31 DIAGNOSIS — Z8249 Family history of ischemic heart disease and other diseases of the circulatory system: Secondary | ICD-10-CM | POA: Diagnosis not present

## 2020-10-31 DIAGNOSIS — Z79899 Other long term (current) drug therapy: Secondary | ICD-10-CM | POA: Insufficient documentation

## 2020-10-31 DIAGNOSIS — K6389 Other specified diseases of intestine: Secondary | ICD-10-CM | POA: Insufficient documentation

## 2020-10-31 DIAGNOSIS — Z801 Family history of malignant neoplasm of trachea, bronchus and lung: Secondary | ICD-10-CM | POA: Insufficient documentation

## 2020-10-31 DIAGNOSIS — K648 Other hemorrhoids: Secondary | ICD-10-CM | POA: Insufficient documentation

## 2020-10-31 DIAGNOSIS — Z1211 Encounter for screening for malignant neoplasm of colon: Secondary | ICD-10-CM | POA: Insufficient documentation

## 2020-10-31 HISTORY — PX: COLONOSCOPY WITH PROPOFOL: SHX5780

## 2020-10-31 HISTORY — PX: POLYPECTOMY: SHX5525

## 2020-10-31 SURGERY — COLONOSCOPY WITH PROPOFOL
Anesthesia: General

## 2020-10-31 MED ORDER — LIDOCAINE HCL (CARDIAC) PF 50 MG/5ML IV SOSY
PREFILLED_SYRINGE | INTRAVENOUS | Status: DC | PRN
  Administered 2020-10-31: 50 mg via INTRAVENOUS

## 2020-10-31 MED ORDER — PROPOFOL 10 MG/ML IV BOLUS
INTRAVENOUS | Status: DC | PRN
  Administered 2020-10-31: 200 mg via INTRAVENOUS
  Administered 2020-10-31: 100 mg via INTRAVENOUS

## 2020-10-31 MED ORDER — LACTATED RINGERS IV SOLN
INTRAVENOUS | Status: DC
  Administered 2020-10-31: 1000 mL via INTRAVENOUS

## 2020-10-31 MED ORDER — PROPOFOL 500 MG/50ML IV EMUL
INTRAVENOUS | Status: DC | PRN
  Administered 2020-10-31: 125 ug/kg/min via INTRAVENOUS

## 2020-10-31 NOTE — Op Note (Signed)
[facility] Patient Name: Leroy Hebert Procedure Date: 10/31/2020 9:33 AM MRN: [id number] Date of Birth: 20-Sep-1975 Attending MD: Elon Alas. Abbey Chatters DO CSN: [id number] Age: 45 Admit Type: Outpatient Procedure:                Colonoscopy Indications:              Screening for colorectal malignant neoplasm Providers:                Elon Alas. Abbey Chatters, DO, Janeece Riggers, RN, Aram Candela Referring MD:              Medicines:                See the Anesthesia note for documentation of the                            administered medications Complications:            No immediate complications. Estimated Blood Loss:     Estimated blood loss was minimal. Procedure:                Pre-Anesthesia Assessment:                           - The anesthesia plan was to use monitored                            anesthesia care (MAC).                           After obtaining informed consent, the colonoscope                            was passed under direct vision. Throughout the                            procedure, the patient's blood pressure, pulse, and                            oxygen saturations were monitored continuously. The                            [id number]-HQ190L ([id number]) scope was introduced through                            the anus and advanced to the the cecum, identified                            by appendiceal orifice and ileocecal valve. The                            colonoscopy was performed without difficulty. The                            patient tolerated the procedure well. The quality  of the bowel preparation was evaluated using the                            BBPS [facility] Bowel Preparation Scale) with scores                            of: Right Colon = 2 (minor amount of residual                            staining, small fragments of stool and/or opaque                            liquid, but mucosa seen well), Transverse Colon = 2                             (minor amount of residual staining, small fragments                            of stool and/or opaque liquid, but mucosa seen                            well) and Left Colon = 2 (minor amount of residual                            staining, small fragments of stool and/or opaque                            liquid, but mucosa seen well). The total BBPS score                            equals 6. The quality of the bowel preparation was                            fair. Scope In: 9:48:33 AM Scope Out: 10:06:40 AM Scope Withdrawal Time: 0 hours 11 minutes 12 seconds  Total Procedure Duration: 0 hours 18 minutes 7 seconds  Findings:      The perianal and digital rectal examinations were normal.      Non-bleeding internal hemorrhoids were found during endoscopy.      A 4 mm polyp was found in the transverse colon. The polyp was       semi-sessile. The polyp was removed with a cold snare. Resection and       retrieval were complete.      The colon (entire examined portion) revealed moderately excessive       looping. Advancing the scope required applying abdominal pressure. Impression:               - Preparation of the colon was fair.                           - Non-bleeding internal hemorrhoids.                           - One 4 mm polyp in the transverse  colon, removed                            with a cold snare. Resected and retrieved.                           - There was significant looping of the colon. Moderate Sedation:      Per Anesthesia Care Recommendation:           - Patient has a contact number available for                            emergencies. The signs and symptoms of potential                            delayed complications were discussed with the                            patient. Return to normal activities tomorrow.                            Written discharge instructions were provided to the                            patient.                            - Resume previous diet.                           - Continue present medications.                           - Await pathology results.                           - Repeat colonoscopy in 5 years for surveillance.                           - Return to GI clinic in 3 months. Procedure Code(s):        --- Professional ---                           [phone], Colonoscopy, flexible; with removal of                            tumor(s), polyp(s), or other lesion(s) by snare                            technique Diagnosis Code(s):        --- Professional ---                           Z12.11, Encounter for screening for malignant                            neoplasm of colon  K64.8, Other hemorrhoids                           K63.5, Polyp of colon CPT copyright 2019 American Medical Association. All rights reserved. The codes documented in this report are preliminary and upon coder review may  be revised to meet current compliance requirements. Elon Alas. Abbey Chatters, DO [place] Abbey Chatters, DO 10/31/2020 10:09:41 AM This report has been signed electronically. Number of Addenda: 0

## 2020-10-31 NOTE — Anesthesia Postprocedure Evaluation (Signed)
Anesthesia Post Note  Patient: Leroy Hebert  Procedure(s) Performed: COLONOSCOPY WITH PROPOFOL POLYPECTOMY  Patient location during evaluation: Phase Hebert Anesthesia Type: General Level of consciousness: awake and alert and oriented Pain management: pain level controlled Vital Signs Assessment: post-procedure vital signs reviewed and stable Respiratory status: spontaneous breathing and respiratory function stable Cardiovascular status: blood pressure returned to baseline and stable Postop Assessment: no apparent nausea or vomiting Anesthetic complications: no   No notable events documented.   Last Vitals:  Vitals:   10/31/20 0859 10/31/20 1011  BP: 125/67 (!) 97/55  Pulse: (!) 56   Resp: 20 17  Temp: 36.6 C 36.4 C  SpO2: 98% 97%    Last Pain:  Vitals:   10/31/20 1011  TempSrc: Oral  PainSc: 0-No pain                 Milady Fleener C Greer Koeppen

## 2020-10-31 NOTE — Interval H&P Note (Signed)
History and Physical Interval Note:  10/31/2020 8:58 AM  Leroy Hebert  has presented today for surgery, with the diagnosis of screening colonoscopy.  The various methods of treatment have been discussed with the patient and family. After consideration of risks, benefits and other options for treatment, the patient has consented to  Procedure(s) with comments: COLONOSCOPY WITH PROPOFOL (N/A) - 9:00am as a surgical intervention.  The patient's history has been reviewed, patient examined, no change in status, stable for surgery.  I have reviewed the patient's chart and labs.  Questions were answered to the patient's satisfaction.     [name]

## 2020-10-31 NOTE — Anesthesia Procedure Notes (Signed)
Date/Time: 10/31/2020 9:44 AM Performed by: Vista Deck, CRNA Pre-anesthesia Checklist: Patient identified, Emergency Drugs available, Suction available, Timeout performed and Patient being monitored Patient Re-evaluated:Patient Re-evaluated prior to induction Oxygen Delivery Method: Nasal Cannula

## 2020-10-31 NOTE — Discharge Instructions (Addendum)
  Colonoscopy Discharge Instructions  Read the instructions outlined below and refer to this sheet in the next few weeks. These discharge instructions provide you with general information on caring for yourself after you leave the hospital. Your doctor may also give you specific instructions. While your treatment has been planned according to the most current medical practices available, unavoidable complications occasionally occur.   ACTIVITY You may resume your regular activity, but move at a slower pace for the next 24 hours.  Take frequent rest periods for the next 24 hours.  Walking will help get rid of the air and reduce the bloated feeling in your belly (abdomen).  No driving for 24 hours (because of the medicine (anesthesia) used during the test).   Do not sign any important legal documents or operate any machinery for 24 hours (because of the anesthesia used during the test).  NUTRITION Drink plenty of fluids.  You may resume your normal diet as instructed by your doctor.  Begin with a light meal and progress to your normal diet. Heavy or fried foods are harder to digest and may make you feel sick to your stomach (nauseated).  Avoid alcoholic beverages for 24 hours or as instructed.  MEDICATIONS You may resume your normal medications unless your doctor tells you otherwise.  WHAT YOU CAN EXPECT TODAY Some feelings of bloating in the abdomen.  Passage of more gas than usual.  Spotting of blood in your stool or on the toilet paper.  IF YOU HAD POLYPS REMOVED DURING THE COLONOSCOPY: No aspirin products for 7 days or as instructed.  No alcohol for 7 days or as instructed.  Eat a soft diet for the next 24 hours.  FINDING OUT THE RESULTS OF YOUR TEST Not all test results are available during your visit. If your test results are not back during the visit, make an appointment with your caregiver to find out the results. Do not assume everything is normal if you have not heard from your  caregiver or the medical facility. It is important for you to follow up on all of your test results.  SEEK IMMEDIATE MEDICAL ATTENTION IF: You have more than a spotting of blood in your stool.  Your belly is swollen (abdominal distention).  You are nauseated or vomiting.  You have a temperature over 101.  You have abdominal pain or discomfort that is severe or gets worse throughout the day.   Your colonoscopy revealed 1 polyp(s) which I removed successfully. Await pathology results, my office will contact you. I recommend repeating colonoscopy in 5 years for surveillance purposes. Follow up with Dr. Abbey Chatters in 3 months. I will check on status of your hep C medication.  OFFICE WILL CALL WITH FOLLOW UP APPPONTMENT   I hope you have a great rest of your week!  Elon Alas. Abbey Chatters, D.O. Gastroenterology and Hepatology [facility]

## 2020-10-31 NOTE — Telephone Encounter (Signed)
Just finished this patient's colonoscopy today.  He also has chronic hepatitis C, genotype Ia, viral load over 8 million.    Can we start the process of getting him Epclusa x12 weeks?

## 2020-10-31 NOTE — Anesthesia Preprocedure Evaluation (Signed)
Anesthesia Evaluation  Patient identified by MRN, date of birth, ID band Patient awake    Reviewed: Allergy & Precautions, NPO status , Patient's Chart, lab work & pertinent test results  History of Anesthesia Complications Negative for: history of anesthetic complications  Airway Mallampati: III  TM Distance: >3 FB Neck ROM: Full    Dental  (+) Dental Advisory Given, Teeth Intact   Pulmonary former smoker,    Pulmonary exam normal breath sounds clear to auscultation       Cardiovascular Exercise Tolerance: Good hypertension, Pt. on medications Normal cardiovascular exam Rhythm:Regular Rate:Normal     Neuro/Psych PSYCHIATRIC DISORDERS Anxiety Depression negative neurological ROS     GI/Hepatic Neg liver ROS, hiatal hernia, GERD  Medicated and Controlled,  Endo/Other  negative endocrine ROS  Renal/GU negative Renal ROS     Musculoskeletal negative musculoskeletal ROS (+)   Abdominal   Peds  Hematology negative hematology ROS (+)   Anesthesia Other Findings   Reproductive/Obstetrics negative OB ROS                             Anesthesia Physical Anesthesia Plan  ASA: 2  Anesthesia Plan: General   Post-op Pain Management:    Induction: Intravenous  PONV Risk Score and Plan: Propofol infusion  Airway Management Planned: Nasal Cannula and Natural Airway  Additional Equipment:   Intra-op Plan:   Post-operative Plan:   Informed Consent: I have reviewed the patients History and Physical, chart, labs and discussed the procedure including the risks, benefits and alternatives for the proposed anesthesia with the patient or authorized representative who has indicated his/her understanding and acceptance.     Dental advisory given  Plan Discussed with: CRNA and Surgeon  Anesthesia Plan Comments:         Anesthesia Quick Evaluation

## 2020-10-31 NOTE — Transfer of Care (Signed)
Immediate Anesthesia Transfer of Care Note  Patient: Leroy Hebert  Procedure(s) Performed: COLONOSCOPY WITH PROPOFOL POLYPECTOMY  Patient Location: Short Stay  Anesthesia Type:General  Level of Consciousness: awake, alert  and oriented  Airway & Oxygen Therapy: Patient Spontanous Breathing  Post-op Assessment: Report given to RN and Post -op Vital signs reviewed and stable  Post vital signs: Reviewed and stable  Last Vitals:  Vitals Value Taken Time  BP    Temp    Pulse 56 10/31/20  1012  Resp 16 10/31/20  1012  SpO2 97 10/31/20  1012    Last Pain:  Vitals:   10/31/20 0859  TempSrc: Oral  PainSc: 0-No pain         Complications: No notable events documented.

## 2020-10-31 NOTE — Telephone Encounter (Signed)
Good morning, Has the pt had a ultrasound with elastography

## 2020-11-02 NOTE — Telephone Encounter (Signed)
Patient had ultrasound in June which showed fatty liver, this is located in care everywhere, does not appear that this was done with elastography however.  Please let him know he will need this done and set him up for [place] with elastography. Thank you

## 2020-11-03 LAB — SURGICAL PATHOLOGY

## 2020-11-03 NOTE — Telephone Encounter (Signed)
Phone the pt and LMOVM for the pt to return the call.

## 2020-11-03 NOTE — Telephone Encounter (Signed)
[facility] / Tretha Sciara please schedule the pt for [place] with elastography per Dr. Abbey Chatters. The pt does agree to this. Pt is aware things are being done to get him started on Epclusa x 12 weeks. (Dx: chronic hepatitis C).

## 2020-11-03 NOTE — Telephone Encounter (Signed)
noted 

## 2020-11-03 NOTE — Addendum Note (Signed)
Addended by: Zara Council C on: 11/03/2020 11:58 AM   Modules accepted: Orders

## 2020-11-03 NOTE — Telephone Encounter (Signed)
US abdomen elastography scheduled for 11/11/20 at 8:30am, arrive at 8:15am. NPO after midnight prior to test.  Tried to call pt, left detailed message on VM to inform him of [place] appt. Appt letter mailed.

## 2020-11-06 ENCOUNTER — Encounter (HOSPITAL_COMMUNITY): Payer: Self-pay | Admitting: Internal Medicine

## 2020-11-11 ENCOUNTER — Other Ambulatory Visit: Payer: Self-pay

## 2020-11-11 ENCOUNTER — Ambulatory Visit (HOSPITAL_COMMUNITY)
Admission: RE | Admit: 2020-11-11 | Discharge: 2020-11-11 | Disposition: A | Discharge status: Home / Self Care | Payer: 59 | Source: Ambulatory Visit | Attending: Internal Medicine | Admitting: Internal Medicine

## 2020-11-11 DIAGNOSIS — B1920 Unspecified viral hepatitis C without hepatic coma: Secondary | ICD-10-CM | POA: Insufficient documentation

## 2020-11-19 ENCOUNTER — Telehealth: Payer: Self-pay

## 2020-11-19 NOTE — Telephone Encounter (Signed)
Pt's Hep. C paperwork needs your signature and clinical and prescription filled out for now. Will get instructions on medication once everything is approved and medication comes in.

## 2020-11-24 ENCOUNTER — Telehealth: Payer: Self-pay

## 2020-11-24 NOTE — Telephone Encounter (Signed)
error 

## 2020-11-24 NOTE — Telephone Encounter (Signed)
The pt is calling back regarding Hep. C paperwork and his medication of when it will be started. This pt's lab results resulted on 10/09/2020. Please advise

## 2020-12-03 ENCOUNTER — Telehealth: Payer: Self-pay

## 2020-12-03 NOTE — Telephone Encounter (Signed)
Phoned and spoke with the pt and advised I needed his signatures on his Hep.C paperwork to send off and the pt states he will be by sometime today because he is working in [place] today.

## 2020-12-03 NOTE — Telephone Encounter (Signed)
Hey Dr. Abbey Chatters, I need this pt's U/S read so I can send it along with his package of paperwork.  thanks

## 2020-12-10 ENCOUNTER — Telehealth: Payer: Self-pay

## 2020-12-10 NOTE — Telephone Encounter (Signed)
Rep from [place] phoned to say that the duration that Dr Abbey Chatters had filled out for the pt was only for 4 weeks and they had been sending paperwork to clarify. I advised her that I was the one who indeed faxed it back last week so she took the verbal over the phone to make it for 12 weeks. Treatment plan for Epclusa.

## 2020-12-11 NOTE — Telephone Encounter (Signed)
[employer] rep called and set date for the pt's medication to arrive to the office. It will be delivered 12/15/2020 between 8 to 4 pm. All paperwork will be in order with the pt's instructions at hand. Once everything is together I will phone the pt so he can arrive to sign for his medication.

## 2020-12-15 ENCOUNTER — Telehealth: Payer: Self-pay | Admitting: Internal Medicine

## 2020-12-15 NOTE — Telephone Encounter (Signed)
Pt's medication was delivered and is locked in the closet.

## 2020-12-16 NOTE — Telephone Encounter (Signed)
Phoned and LMOVM for the pt to return call 

## 2020-12-18 NOTE — Telephone Encounter (Signed)
Phoned the pt and attempted to LM but pt's vm was full

## 2020-12-24 NOTE — Telephone Encounter (Signed)
Attempted to call the pt and his vm was full, no message left.

## 2020-12-24 NOTE — Telephone Encounter (Signed)
Phoned and spoke with the pt and tried to set up day/time for the pt to receive his medications and he has been working out of town for past 3 weeks and he is out of town now. Will talk to Reba to see what we can try and come up with a plan to get him started on Hep. C medication. Advised the pt I will get with him once we come up with a plan. (Right now it looks like Friday evening is the only time pt can really do this). Medication had arrived on Monday, October 31st.

## 2020-12-25 NOTE — Telephone Encounter (Signed)
Pt phoned and advised me that he will be by Monday, November 14th or Wed the 16th first thing in the am to sign and pick up medication.

## 2020-12-26 ENCOUNTER — Other Ambulatory Visit: Payer: Self-pay

## 2020-12-26 DIAGNOSIS — B1920 Unspecified viral hepatitis C without hepatic coma: Secondary | ICD-10-CM

## 2020-12-26 DIAGNOSIS — R7989 Other specified abnormal findings of blood chemistry: Secondary | ICD-10-CM

## 2020-12-29 NOTE — Telephone Encounter (Signed)
Pt came in this morning to sign and pick up instructions and his medication. He was instructed to what date to do his 4 weeks lab work (any [facility]). Also instructed to keep all his paperwork and medication together. Also this pt was instructed that when he gets on the 3rd week of medication if the Specialty Pharmacy call him to please answer the call so he will not miss a dose of this medication. Will gather all paperwork today and send to scan for the pt's chart. Pt was instructed several times on the instructions and that if he has any questions to please call me. Pt expressed understanding.

## 2020-12-29 NOTE — Telephone Encounter (Signed)
Paperwork gathered and sent to scan.

## 2021-01-05 ENCOUNTER — Telehealth: Payer: Self-pay | Admitting: *Deleted

## 2021-01-05 NOTE — Telephone Encounter (Signed)
Spoke to [employer] from [facility], she informed me that pt's Epclusa with be sent to the office on 01/06/2021.

## 2021-01-06 NOTE — Telephone Encounter (Signed)
Pt's medication has arrived and it is locked in the cabinet

## 2021-01-07 NOTE — Telephone Encounter (Signed)
Phoned the pt and attempted to leave vm, but mailbox was full.

## 2021-01-07 NOTE — Telephone Encounter (Signed)
Pt returned call and was advised his medication was here to be picked up. Pt states if not today it will be next week.

## 2021-01-28 NOTE — Telephone Encounter (Signed)
FYI: I received a call from [facility] [employer] [phone]) regarding a time to deliver the pt's 3rd round of Epclusa. I advised her that the pt has not picked up his 2nd round from the office yet. I advised her that I would call him today. She is putting the medication on hold (so they will know that the pt is still on his medication, she did mention he may need to start over if there is a lapse in the medication use. I agreed to put a hold on it until he gets the 2nd round and get started. 2. I phoned the pt to see what happened to him coming in on last week of November, he stated that he has been in [place] on a job but they come home on Thursday nights and we close early on Friday's. He states he still has a few pills left until Monday Dec 19th and he will come by here to get the 2nd round of medication. Are there any instructions you want me to give the pt when he arrives? Please advise.

## 2021-02-02 NOTE — Telephone Encounter (Signed)
FYI: Pt picked up medication and signed for it. Pt was given his 28 day supply. Pt stated he will start today and if this happens then his last dose should be March 01, 2021. Pt was given his lab forms to have blood work completed this week. Pt expressed understanding to all of this. Delivery slip gave to Manuela Schwartz for scan.

## 2021-02-03 ENCOUNTER — Encounter: Payer: Self-pay | Admitting: Internal Medicine

## 2021-03-12 ENCOUNTER — Other Ambulatory Visit (HOSPITAL_COMMUNITY)
Admission: RE | Admit: 2021-03-12 | Discharge: 2021-03-12 | Disposition: A | Discharge status: Home / Self Care | Payer: 59 | Attending: Internal Medicine | Admitting: Internal Medicine

## 2021-03-12 ENCOUNTER — Encounter: Payer: Self-pay | Admitting: Internal Medicine

## 2021-03-12 ENCOUNTER — Ambulatory Visit: Payer: 59 | Admitting: Internal Medicine

## 2021-03-12 ENCOUNTER — Other Ambulatory Visit: Payer: Self-pay

## 2021-03-12 VITALS — BP 144/82 | HR 70 | Temp 97.4°F | Ht 73.0 in | Wt 363.8 lb

## 2021-03-12 DIAGNOSIS — B1920 Unspecified viral hepatitis C without hepatic coma: Secondary | ICD-10-CM | POA: Diagnosis present

## 2021-03-12 DIAGNOSIS — K219 Gastro-esophageal reflux disease without esophagitis: Secondary | ICD-10-CM | POA: Diagnosis not present

## 2021-03-12 NOTE — Patient Instructions (Signed)
I will check your hepatitis C viral load today.  If it is undetectable, we will check it again in 3 months.  If it is undetectable in 3 months as well then you are considered cured and will require no further treatment.  If either test shows active hepatitis C, then we will need to retreat with 12-week course of Epclusa.  I will call you with these results.  It was good seeing you again today.  Dr. Abbey Chatters  At [facility] we value your feedback. You may receive a survey about your visit today. Please share your experience as we strive to create trusting relationships with our patients to provide genuine, compassionate, quality care.  We appreciate your understanding and patience as we review any laboratory studies, imaging, and other diagnostic tests that are ordered as we care for you. Our office policy is 5 business days for review of these results, and any emergent or urgent results are addressed in a timely manner for your best interest. If you do not hear from our office in 1 week, please contact us.   We also encourage the use of MyChart, which contains your medical information for your review as well. If you are not enrolled in this feature, an access code is on this after visit summary for your convenience. Thank you for allowing [place] to be involved in your care.  It was great to see you today!  I hope you have a great rest of your Winter!    Leroy Hebert. Abbey Chatters, D.O. Gastroenterology and Hepatology [facility]

## 2021-03-12 NOTE — Progress Notes (Signed)
Primary Care Physician:  Manon Hilding, MD Primary Gastroenterologist:  Dr. Abbey Chatters  Chief Complaint  Patient presents with   Follow-up    HPI:   Leroy Hebert is a 46 y.o. male who presents to clinic today for follow-up visit for hepatitis C.  Patient previously had blood work done which showed abnormal LFTs, AST 255, ALT 459, alk phos 101, T bili 0.8.  Subsequent viral hepatitis panel showed hepatitis C antibody positive, hep B surface antigen negative, hep B core antibody negative, hep a antibody negative.  Patient was somewhat surprised about this diagnosis.  He denies any history of IV drug use.  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  Does not know any contacts with hepatitis C.  No blood transfusions.  Underwent ultrasound which showed fatty liver disease, otherwise unremarkable.  Elastography with mean K PA of 2.7.  Hepatitis C genotype Ia, viral load 8.3 million.  HIV negative.  We were able to obtain Epclusa for patient though there has been issues with noncompliance.  He completed a total of 8 weeks of treatment last dose approximately 12 to 15 days ago.  He did not call to to have his last 4-week shipment as he was unaware that he needed to do so.  Patient also notes chronic reflux which is well controlled on omeprazole 20 mg.  States he takes this usually every 1 to 2 days.  No dysphagia or odynophagia.  No chest or epigastric pain.  No melena hematochezia.  Colonoscopy 10/31/2020 with 1 tubular adenoma removed with recommended 5-year recall.  Past Medical History:  Diagnosis Date   Anxiety    Avascular necrosis of bone of right hip ([place]) 06/15/2016   Depression    GERD (gastroesophageal reflux disease)    Hernia, hiatal    Hypertension 06/04/2016   pt no longer prescribed medication for HTN    Past Surgical History:  Procedure Laterality Date   COLONOSCOPY WITH PROPOFOL N/A 10/31/2020   Procedure: COLONOSCOPY WITH PROPOFOL;  Surgeon: [name], DO;  [facility]: [facility];  Service: Endoscopy;  Laterality: N/A;  9:00am   POLYPECTOMY  10/31/2020   Procedure: POLYPECTOMY;  Surgeon: [name], DO;  Location: [facility];  Service: Endoscopy;;   TONSILLECTOMY     TOTAL HIP ARTHROPLASTY Left 06/15/2016   Procedure: TOTAL HIP ARTHROPLASTY;  Surgeon: Marchia Bond, MD;  Location: [facility];  Service: Orthopedics;  Laterality: Left;    Current Outpatient Medications  Medication Sig Dispense Refill   losartan-hydrochlorothiazide (HYZAAR) 50-12.5 MG tablet Take 1 tablet by mouth daily.     Multiple Vitamin (MULTIVITAMIN WITH MINERALS) TABS tablet Take 5 tablets by mouth daily.     omeprazole (PRILOSEC) 20 MG capsule Take 20 mg by mouth daily.     No current facility-administered medications for this visit.    Allergies as of 03/12/2021   (No Known Allergies)    Family History  Problem Relation Age of Onset   Lung cancer Mother        with mets   Heart disease Father    Colon cancer Neg Hx    Stomach cancer Neg Hx     Social History   Socioeconomic History   Marital status: Divorced    Spouse name: Not on file   Number of children: 2   Years of education: Not on file   Highest education level: Not on file  Occupational History   Not on file  Tobacco Use   Smoking status: Former    Types: Cigarettes    Quit date: 05/04/2016    Years since quitting: 4.8   Smokeless tobacco: Never  Vaping Use   Vaping Use: Never used  Substance and Sexual Activity   Alcohol use: Not Currently    Comment: none in 5 years but prior was a drinker   Drug use: Not Currently    Types: Marijuana    Comment: none in 9 yeats   Sexual activity: Yes    Partners: Female  Other Topics Concern   Not on file  Social History Narrative   Single       2 girls       Nature conservation officer    Social Determinants of Health   Financial Resource Strain: Not on file  Food Insecurity: Not on file  Transportation Needs: Not on file   Physical Activity: Not on file  Stress: Not on file  Social Connections: Not on file  Intimate Partner Violence: Not on file    Subjective: Review of Systems  Constitutional:  Negative for chills and fever.  HENT:  Negative for congestion and hearing loss.   Eyes:  Negative for blurred vision and double vision.  Respiratory:  Negative for cough and shortness of breath.   Cardiovascular:  Negative for chest pain and palpitations.  Gastrointestinal:  Positive for heartburn. Negative for abdominal pain, blood in stool, constipation, diarrhea, melena and vomiting.  Genitourinary:  Negative for dysuria and urgency.  Musculoskeletal:  Negative for joint pain and myalgias.  Skin:  Negative for itching and rash.  Neurological:  Negative for dizziness and headaches.  Psychiatric/Behavioral:  Negative for depression. The patient is not nervous/anxious.       Objective: BP (!) 144/82    Pulse 70    Temp (!) 97.4 F (36.3 C) (Temporal)    Ht $R'6\' 1"'qg$  (1.854 m)    Wt (!) 363 lb 12.8 oz (165 kg)    BMI 48.00 kg/m  Physical Exam Constitutional:      Appearance: Normal appearance. He is obese.  HENT:     Head: Normocephalic and atraumatic.  Eyes:     Extraocular Movements: Extraocular movements intact.     Conjunctiva/sclera: Conjunctivae normal.  Cardiovascular:     Rate and Rhythm: Normal rate and regular rhythm.  Pulmonary:     Effort: Pulmonary effort is normal.     Breath sounds: Normal breath sounds.  Abdominal:     General: Bowel sounds are normal.     Palpations: Abdomen is soft.  Musculoskeletal:        General: Normal range of motion.     Cervical back: Normal range of motion and neck supple.  Skin:    General: Skin is warm.  Neurological:     General: No focal deficit present.     Mental Status: He is alert and oriented to person, place, and time.  Psychiatric:        Mood and Affect: Mood normal.        Behavior: Behavior normal.     Assessment: *Hepatitis C  antibody positive *Fatty liver disease *Abnormal liver function test *Acid reflux-well-controlled on omeprazole 20 mg  Plan: Discussed hepatitis C in depth with patient again today.  History of noncompliance in the past.  Only completed 8 weeks of treatment with Epclusa now has been without medication for nearly 12 to 15 days.  Unfortunately we would be unable to obtain his last 4 weeks of  treatment until next week.  I will check viral load today.  If undetectable, will check again in 3 months.  If both are undetectable, will consider him cured despite only receiving 8 weeks of treatment.  If either test is positive then we will need to retreat him with a full 12-week course of Epclusa.  Patient's acid reflux well-controlled on omeprazole 20 mg.  We will continue.  No alarm symptoms today to warrant further investigation with EGD.  Further recommendations to follow.  03/12/2021 4:54 PM   Disclaimer: This note was dictated with voice recognition software. Similar sounding words can inadvertently be transcribed and may not be corrected upon review.

## 2021-03-13 LAB — HCV RNA QUANT RFLX ULTRA OR GENOTYP
HCV RNA Qnt(log copy/mL): UNDETERMINED log10 IU/mL
HepC Qn: NOT DETECTED IU/mL

## 2021-03-17 ENCOUNTER — Other Ambulatory Visit: Payer: Self-pay

## 2021-03-17 DIAGNOSIS — B1920 Unspecified viral hepatitis C without hepatic coma: Secondary | ICD-10-CM

## 2021-03-19 ENCOUNTER — Ambulatory Visit: Payer: 59 | Admitting: Internal Medicine

## 2021-04-17 ENCOUNTER — Other Ambulatory Visit: Payer: Self-pay | Admitting: *Deleted

## 2021-04-17 DIAGNOSIS — B1920 Unspecified viral hepatitis C without hepatic coma: Secondary | ICD-10-CM

## 2021-07-06 ENCOUNTER — Telehealth: Payer: Self-pay

## 2021-07-06 NOTE — Telephone Encounter (Signed)
The pt (and his wife) donated blood for the [employer] earlier this month and a few days later it came back stating that Hep C was detected in his blood. Pt stated he was advised that it was not found last blood work he had here. Pt states that he would like to be checked again to be safe. Please advise.

## 2021-07-06 NOTE — Telephone Encounter (Signed)
Returned the pt's call and LMOVM for him to call me back on my line

## 2021-07-09 ENCOUNTER — Telehealth: Payer: Self-pay | Admitting: Internal Medicine

## 2021-07-09 DIAGNOSIS — B1920 Unspecified viral hepatitis C without hepatic coma: Secondary | ICD-10-CM

## 2021-07-09 NOTE — Telephone Encounter (Signed)
Phoned and advised the pt of the above note and the pt will go to the lab tomorrow and have this done. Explained that the blood bank note and that the viral load will tell us what we need to know. Pt expressed understanding

## 2021-07-09 NOTE — Telephone Encounter (Signed)
Patient was supposed to have repeat viral load in March.  Does not appear that he had this done.  I will order hepatitis C viral load to be done at [facility] lab (or he can go to the hospital).  We will call him with these results.  Of note, he will be hepatitis C antibody positive forever which is likely what the blood bank checked.  This does not necessarily indicate active infection.  His viral load will tell us if he is truly cured or not.

## 2021-07-10 ENCOUNTER — Other Ambulatory Visit (HOSPITAL_COMMUNITY)
Admission: RE | Admit: 2021-07-10 | Discharge: 2021-07-10 | Disposition: A | Discharge status: Home / Self Care | Payer: 59 | Source: Ambulatory Visit | Attending: Internal Medicine | Admitting: Internal Medicine

## 2021-07-10 DIAGNOSIS — B1920 Unspecified viral hepatitis C without hepatic coma: Secondary | ICD-10-CM | POA: Diagnosis present

## 2021-07-11 LAB — HCV RNA QUANT
HCV Quantitative Log: 5.442 log10 IU/mL (ref >1.70)
HCV Quantitative: 277000 IU/mL (ref >50)

## 2021-07-13 LAB — HCV RNA QUANT RFLX ULTRA OR GENOTYP
HCV RNA Qnt(log copy/mL): 5.43 log10 IU/mL
HepC Qn: 269000 IU/mL

## 2021-07-13 LAB — HEPATITIS C GENOTYPE

## 2021-07-30 ENCOUNTER — Other Ambulatory Visit: Payer: Self-pay | Admitting: Internal Medicine

## 2021-07-30 MED ORDER — SOFOSBUVIR-VELPATASVIR 400-100 MG PO TABS: 1.0000 | ORAL_TABLET | Freq: Every day | ORAL | 2 refills | Status: DC

## 2021-08-11 ENCOUNTER — Other Ambulatory Visit: Payer: Self-pay

## 2021-08-11 MED ORDER — SOFOSBUVIR-VELPATASVIR 400-100 MG PO TABS: 1.0000 | ORAL_TABLET | Freq: Every day | ORAL | 2 refills | Status: DC

## 2021-08-27 ENCOUNTER — Telehealth: Payer: Self-pay

## 2021-08-27 NOTE — Telephone Encounter (Signed)
Dr. Abbey Chatters,  Phoned to advise the pt that his medication Raeanne Gathers) arrived today but his vm was full. Will lock the medication for today. Will try and schedule with pt tomorrow for pick up next week.  Is there anything else I need to do regarding this pt? Please advise

## 2021-08-28 NOTE — Telephone Encounter (Signed)
Noted, not sure what else there is to do. He needs to get his medication

## 2021-08-31 ENCOUNTER — Telehealth: Payer: Self-pay

## 2021-08-31 NOTE — Telephone Encounter (Signed)
Pt picked up his medication today and signed for it. Pt was on his way out of town for work. Will give to Manuela Schwartz to scan

## 2021-08-31 NOTE — Telephone Encounter (Signed)
FYI:  Pt came for his medication this morning. Please advise on what labs the pt will need to have done when he is finished.

## 2021-09-01 ENCOUNTER — Other Ambulatory Visit: Payer: Self-pay

## 2021-09-01 DIAGNOSIS — B1920 Unspecified viral hepatitis C without hepatic coma: Secondary | ICD-10-CM

## 2021-09-01 NOTE — Telephone Encounter (Signed)
Labs completed for 6 weeks half way through treatment. Will mail out in August

## 2021-09-01 NOTE — Telephone Encounter (Signed)
Please schedule patient for labs (CBC, CMP, HCV RNA) halfway through treatment and 3 months after completion

## 2021-09-28 ENCOUNTER — Telehealth: Payer: Self-pay | Admitting: *Deleted

## 2021-09-28 NOTE — Telephone Encounter (Signed)
Pt left a VM stating he was almost out of meds. Did not specify which med he was referring too.

## 2021-09-29 ENCOUNTER — Other Ambulatory Visit: Payer: Self-pay

## 2021-09-29 MED ORDER — MAVYRET 100-40 MG PO TABS: 3.0000 | ORAL_TABLET | Freq: Every day | ORAL | 1 refills | Status: AC

## 2021-09-29 NOTE — Telephone Encounter (Signed)
[employer] called back stating that the pt's insurance is not going to pay for him to continue the Epclusa due to exceeding the qty limit. Dr. Abbey Chatters approved for Mayvret to be sent in. PA has been submitted waiting for approval/denial.

## 2021-09-29 NOTE — Telephone Encounter (Signed)
Spoke with [facility] and they will be delivering the patients hep c medication to the office on 09/30/21. Pt was made aware.

## 2021-10-01 NOTE — Telephone Encounter (Signed)
PA for Mayvret has been approved, spoke with [facility] and they are waiting on approval/denial for patient assistance due to high copay.

## 2021-10-05 NOTE — Telephone Encounter (Signed)
Patient's Leroy Hebert will be delivered to the office on Thursday 10/08/2021. Pt was notified.

## 2021-10-14 LAB — HCV RNA QUANT: Hepatitis C Quantitation: <15 IU/mL

## 2021-10-14 LAB — CBC WITH DIFFERENTIAL/PLATELET
Basophils Absolute: 0 10*3/uL (ref 0.0–0.2)
Basos: 0 %
EOS (ABSOLUTE): 0.2 10*3/uL (ref 0.0–0.4)
Eos: 2 %
Hematocrit: 48.3 % (ref 37.5–51.0)
Hemoglobin: 16.2 g/dL (ref 13.0–17.7)
Immature Grans (Abs): 0 10*3/uL (ref 0.0–0.1)
Immature Granulocytes: 0 %
Lymphocytes Absolute: 3.6 10*3/uL — ABNORMAL HIGH (ref 0.7–3.1)
Lymphs: 29 %
MCH: 27.2 pg (ref 26.6–33.0)
MCHC: 33.5 g/dL (ref 31.5–35.7)
MCV: 81 fL (ref 79–97)
Monocytes Absolute: 0.7 10*3/uL (ref 0.1–0.9)
Monocytes: 6 %
Neutrophils Absolute: 7.8 10*3/uL — ABNORMAL HIGH (ref 1.4–7.0)
Neutrophils: 63 %
Platelets: 114 10*3/uL — ABNORMAL LOW (ref 150–450)
RBC: 5.95 x10E6/uL — ABNORMAL HIGH (ref 4.14–5.80)
RDW: 13 % (ref 11.6–15.4)
WBC: 12.4 10*3/uL — ABNORMAL HIGH (ref 3.4–10.8)

## 2021-10-14 LAB — COMPREHENSIVE METABOLIC PANEL
ALT: 26 IU/L (ref 0–44)
AST: 23 IU/L (ref 0–40)
Albumin/Globulin Ratio: 1.6 (ref 1.2–2.2)
Albumin: 4.4 g/dL (ref 4.1–5.1)
Alkaline Phosphatase: 74 IU/L (ref 44–121)
BUN/Creatinine Ratio: 8 — ABNORMAL LOW (ref 9–20)
BUN: 9 mg/dL (ref 6–24)
Bilirubin Total: 0.5 mg/dL (ref 0.0–1.2)
CO2: 21 mmol/L (ref 20–29)
Calcium: 10.1 mg/dL (ref 8.7–10.2)
Chloride: 102 mmol/L (ref 96–106)
Creatinine, Ser: 1.13 mg/dL (ref 0.76–1.27)
Globulin, Total: 2.7 g/dL (ref 1.5–4.5)
Glucose: 104 mg/dL — ABNORMAL HIGH (ref 70–99)
Potassium: 4.1 mmol/L (ref 3.5–5.2)
Sodium: 142 mmol/L (ref 134–144)
Total Protein: 7.1 g/dL (ref 6.0–8.5)
eGFR: 80 mL/min/{1.73_m2} (ref >59)

## 2021-12-08 ENCOUNTER — Telehealth: Payer: Self-pay

## 2021-12-08 DIAGNOSIS — K429 Umbilical hernia without obstruction or gangrene: Secondary | ICD-10-CM

## 2021-12-08 NOTE — Telephone Encounter (Signed)
Pt called advising he had finished his Hep C medication about 5 days ago and needs to have labs done. What labs do you want to order? Pt also asked if we can do a referral to a general surgeon regarding his hernia or will he need to schedule a ov first. Please advise

## 2021-12-10 ENCOUNTER — Other Ambulatory Visit: Payer: Self-pay

## 2021-12-10 DIAGNOSIS — B1920 Unspecified viral hepatitis C without hepatic coma: Secondary | ICD-10-CM

## 2021-12-10 DIAGNOSIS — R7989 Other specified abnormal findings of blood chemistry: Secondary | ICD-10-CM

## 2021-12-10 NOTE — Telephone Encounter (Signed)
Referral sent to [facility] 

## 2021-12-10 NOTE — Addendum Note (Signed)
Addended by: Cheron Every on: 12/10/2021 02:20 PM   Modules accepted: Orders

## 2021-12-10 NOTE — Telephone Encounter (Signed)
Noted  Labs put in and released at [facility]. Phoned and spoke with the pt advised of having labs done @ [facility]. Pt expressed understanding

## 2021-12-10 NOTE — Telephone Encounter (Signed)
Dena please order hepatitis C viral load, LFTs.  Mindy please refer to general surgeon for hernia.  Thank you

## 2022-04-06 ENCOUNTER — Other Ambulatory Visit: Payer: Self-pay | Admitting: Family Medicine

## 2022-04-06 ENCOUNTER — Ambulatory Visit: Payer: Self-pay

## 2022-04-06 DIAGNOSIS — M545 Low back pain, unspecified: Secondary | ICD-10-CM

## 2022-04-06 DIAGNOSIS — M549 Dorsalgia, unspecified: Secondary | ICD-10-CM

## 2022-04-06 DIAGNOSIS — M542 Cervicalgia: Secondary | ICD-10-CM
